# Patient Record
Sex: Female | Born: 1985
Health system: Southern US, Community
[De-identification: ages and names within clinical notes are randomized; demographics above are authoritative.]

## PROBLEM LIST (undated history)

## (undated) DIAGNOSIS — B999 Unspecified infectious disease: Secondary | ICD-10-CM

## (undated) DIAGNOSIS — F53 Postpartum depression: Secondary | ICD-10-CM

## (undated) DIAGNOSIS — Z87898 Personal history of other specified conditions: Secondary | ICD-10-CM

## (undated) DIAGNOSIS — E039 Hypothyroidism, unspecified: Secondary | ICD-10-CM

## (undated) DIAGNOSIS — R87629 Unspecified abnormal cytological findings in specimens from vagina: Secondary | ICD-10-CM

## (undated) DIAGNOSIS — O99345 Other mental disorders complicating the puerperium: Secondary | ICD-10-CM

## (undated) DIAGNOSIS — R51 Headache: Secondary | ICD-10-CM

## (undated) HISTORY — DX: Hypothyroidism, unspecified: E03.9

## (undated) HISTORY — DX: Other mental disorders complicating the puerperium: O99.345

## (undated) HISTORY — DX: Postpartum depression: F53.0

## (undated) HISTORY — PX: NO PAST SURGERIES: SHX2092

## (undated) HISTORY — DX: Personal history of other specified conditions: Z87.898

## (undated) HISTORY — DX: Headache: R51

## (undated) HISTORY — DX: Unspecified infectious disease: B99.9

---

## 2008-03-23 DIAGNOSIS — E039 Hypothyroidism, unspecified: Secondary | ICD-10-CM

## 2008-03-23 HISTORY — DX: Hypothyroidism, unspecified: E03.9

## 2011-03-24 NOTE — L&D Delivery Note (Signed)
Delivery Note At 7:39 PM a viable female, "Cynthia Love", was delivered via Vaginal, Spontaneous Delivery (Presentation: ; Occiput Anterior).  APGAR: 9, 9; weight .   Placenta status: Intact, Spontaneous.  Cord: 3 vessels with the following complications: None.  Cord pH: NA  Anesthesia: None  Episiotomy: None Lacerations: 2nd degree perineal, irregular edges; bilateral periurethral 1st degree lacerations--right side required 1 stitch for hemostasis.  Red Robinson catheter placed in urethra prior to repair to avoid urethral entry with suture. Suture Repair: 3.0 and 4.0 monocryl Est. Blood Loss (mL):  300 cc.  Mom to postpartum.  Baby to skin to skin.. Family desires inpatient circumcision.  Nigel Bridgeman 03/15/2012, 8:29 PM

## 2011-09-18 ENCOUNTER — Encounter: Payer: Self-pay | Admitting: Obstetrics and Gynecology

## 2011-09-18 ENCOUNTER — Ambulatory Visit (INDEPENDENT_AMBULATORY_CARE_PROVIDER_SITE_OTHER): Payer: 59 | Admitting: Obstetrics and Gynecology

## 2011-09-18 VITALS — BP 110/62 | Wt 153.0 lb

## 2011-09-18 DIAGNOSIS — Z331 Pregnant state, incidental: Secondary | ICD-10-CM | POA: Insufficient documentation

## 2011-09-18 DIAGNOSIS — E039 Hypothyroidism, unspecified: Secondary | ICD-10-CM

## 2011-09-18 DIAGNOSIS — Z3201 Encounter for pregnancy test, result positive: Secondary | ICD-10-CM

## 2011-09-18 DIAGNOSIS — Z124 Encounter for screening for malignant neoplasm of cervix: Secondary | ICD-10-CM

## 2011-09-18 LAB — POCT URINALYSIS DIPSTICK
Leukocytes, UA: NEGATIVE
Nitrite, UA: NEGATIVE
Urobilinogen, UA: NEGATIVE

## 2011-09-18 LAB — POCT WET PREP (WET MOUNT)
Clue Cells Wet Prep Whiff POC: NEGATIVE
pH: 4

## 2011-09-18 NOTE — Progress Notes (Signed)
Patient ID: Cynthia Love, female   DOB: June 14, 1985, 26 y.o.   MRN: [id number]  Chief Complaint  Patient presents with  . Initial Prenatal Visit    HPI Cynthia Love is a 26 y.o. female.   HPI  Past Medical History  Diagnosis Date  . Hypothyroidism 2010  . Infection     UTI X 1  . Headache     MIGRAINE 1-2 X MONTH;  OTC MED    Past Surgical History  Procedure Date  . No past surgeries     Family History  Problem Relation Age of Onset  . Hashimoto's thyroiditis Mother   . Heart disease Father     MYCOCARDIAL INFARCTION  . Early death Father 37  . Hyperlipidemia Father   . Hypertension Father   . Kidney disease Father     DIALYSIS  . Schizophrenia Maternal Aunt   . Cancer Maternal Grandmother     PANCREATIC  . Cancer Maternal Grandfather     NON HODGKINS LYNMPHOMA  . Heart disease Paternal Grandmother   . Heart disease Paternal Grandfather     Social History History  Substance Use Topics  . Smoking status: Never Smoker   . Smokeless tobacco: Never Used  . Alcohol Use: No     1-2 X/MONTH    No Known Allergies  Current Outpatient Prescriptions  Medication Sig Dispense Refill  . fish oil-omega-3 fatty acids 1000 MG capsule Take 1 capsule by mouth daily.      Marland Kitchen levothyroxine (SYNTHROID, LEVOTHROID) 100 MCG tablet Take 100 mcg by mouth daily.      . Prenatal Vit-Fe Sulfate-FA (PRENATAL VITAMIN PO) Take 1 tablet by mouth. OTC        Review of Systems Review of Systems  Constitutional: Positive for activity change. Negative for fever, chills, diaphoresis, appetite change, fatigue and unexpected weight change.  HENT: Negative for hearing loss, ear pain, nosebleeds, congestion, facial swelling, rhinorrhea, sneezing, neck pain, neck stiffness, postnasal drip, tinnitus and ear discharge.   Eyes: Negative.  Negative for photophobia, pain, discharge, redness, itching and visual disturbance.  Respiratory: Negative for apnea, cough, choking, chest tightness,  shortness of breath and stridor.   Cardiovascular: Negative.  Negative for chest pain.  Genitourinary: Negative.   Neurological: Negative.   Hematological: Negative.   Psychiatric/Behavioral: Negative.     Blood pressure 110/62, weight 69.4 kg (153 lb), last menstrual period 05/30/2011.  Physical Exam Physical Exam  Constitutional: She is oriented to person, place, and time. She appears well-developed and well-nourished. No distress.  HENT:  Head: Normocephalic.  Mouth/Throat: No oropharyngeal exudate.  Eyes: Pupils are equal, round, and reactive to light.  Neck: Normal range of motion.  Cardiovascular: Normal rate.   Pulmonary/Chest: Effort normal.  Abdominal: Soft.  Genitourinary: Vagina normal.       Fundal height to dates  FHT: 165  Musculoskeletal: Normal range of motion.  Neurological: She is alert and oriented to person, place, and time. She has normal reflexes.  Skin: Skin is warm and dry. She is not diaphoretic.  Psychiatric: She has a normal mood and affect.     Earl Gala Intrauterine Pregnancy @ 14+6 wks, No complaints. Had n&v but states improved Speculum examination: Wet prep: Norm, GC, Chlamydia cultures to lab, no pap and norm x1 yr FHT heard with Doppler: 165 FH:13.5cms Continues on Synthroid po daily and PNV daily Bowels: regular Declined genetic screening at this time and wishes to wait until after anatomy screen for quad  screen.had Prenatal panel and thyroid panel today 09/18/11 Plan:

## 2011-09-18 NOTE — Progress Notes (Signed)
Plan: Prenatal panel and thyroid panel today 09/17/12 Patient wishes to breast feed Patient would like to have water birth and possibly Elige Radon Method (info given) Wishes to wait for Quad screen S/P anatomy USS ROB x 4 weeks for anatomy USS          ROB x 4 weeks -

## 2011-09-19 LAB — GC/CHLAMYDIA PROBE AMP, GENITAL
Chlamydia, DNA Probe: NEGATIVE
GC Probe Amp, Genital: NEGATIVE

## 2011-09-19 LAB — TOXOPLASMA ANTIBODIES- IGG AND  IGM: Toxoplasma IgG Ratio: 0.7 IU/mL (ref <6.4)

## 2011-09-19 LAB — PRENATAL PANEL VII
Antibody Screen: NEGATIVE
Basophils Absolute: 0 10*3/uL (ref 0.0–0.1)
Basophils Relative: 0 % (ref 0–1)
Eosinophils Absolute: 0.1 10*3/uL (ref 0.0–0.7)
Eosinophils Relative: 2 % (ref 0–5)
Lymphocytes Relative: 23 % (ref 12–46)
MCV: 84 fL (ref 78.0–100.0)
Platelets: 175 10*3/uL (ref 150–400)
RDW: 14.2 % (ref 11.5–15.5)
WBC: 7.1 10*3/uL (ref 4.0–10.5)

## 2011-09-19 LAB — THYROID PANEL WITH TSH
T3 Uptake: 20.8 % — ABNORMAL LOW (ref 22.5–37.0)
TSH: 5.974 u[IU]/mL — ABNORMAL HIGH (ref 0.350–4.500)

## 2011-09-20 LAB — CULTURE, OB URINE: Colony Count: 5000

## 2011-09-21 ENCOUNTER — Telehealth: Payer: Self-pay | Admitting: Obstetrics and Gynecology

## 2011-09-21 NOTE — Telephone Encounter (Signed)
Discussed po fluids with calories when food is not tolerated and go to sips of fluids q 15 minutes with gradually increasing over the next hours for better tolerates then a lot on stomach at one time, come in for evaluation if N,V persists over the next hour or two, do not repeat zofran until 8 hours. Lavera Guise, CNM

## 2011-09-23 ENCOUNTER — Other Ambulatory Visit: Payer: Self-pay

## 2011-09-23 ENCOUNTER — Telehealth: Payer: Self-pay

## 2011-09-23 DIAGNOSIS — R7989 Other specified abnormal findings of blood chemistry: Secondary | ICD-10-CM

## 2011-09-23 NOTE — Telephone Encounter (Signed)
Lm on vm tcb rgd labs 

## 2011-09-23 NOTE — Telephone Encounter (Signed)
Spoke with pt rgd labs informed thyroid panel abnormal need f/u with pcp or endocrinologist pt states she doesnt have one at this time advised pt will refer to endocrinologist they will call with appt pt voice understanding

## 2011-09-23 NOTE — Telephone Encounter (Signed)
Message copied by Rolla Plate on Wed Sep 23, 2011  9:11 AM ------      Message from: Cynthia Love      Created: Tue Sep 22, 2011 11:20 PM       Pt with abnormal thyroid panel.  Refer pt to her PCP or endocrinologist for evaluation

## 2011-09-28 ENCOUNTER — Telehealth: Payer: Self-pay

## 2011-09-28 NOTE — Telephone Encounter (Signed)
Spoke with pt rgd referral to endocrinologist pt has appt 10/06/11 with Dr.preston clark at 12:00 pt voice understanding

## 2011-10-16 ENCOUNTER — Encounter: Payer: Self-pay | Admitting: Obstetrics and Gynecology

## 2011-10-16 ENCOUNTER — Ambulatory Visit (INDEPENDENT_AMBULATORY_CARE_PROVIDER_SITE_OTHER): Payer: 59

## 2011-10-16 ENCOUNTER — Ambulatory Visit (INDEPENDENT_AMBULATORY_CARE_PROVIDER_SITE_OTHER): Payer: 59 | Admitting: Obstetrics and Gynecology

## 2011-10-16 VITALS — BP 102/58 | Ht 63.0 in | Wt 156.0 lb

## 2011-10-16 DIAGNOSIS — Z331 Pregnant state, incidental: Secondary | ICD-10-CM

## 2011-10-16 DIAGNOSIS — E039 Hypothyroidism, unspecified: Secondary | ICD-10-CM

## 2011-10-16 DIAGNOSIS — O99280 Endocrine, nutritional and metabolic diseases complicating pregnancy, unspecified trimester: Secondary | ICD-10-CM

## 2011-10-16 DIAGNOSIS — Z3689 Encounter for other specified antenatal screening: Secondary | ICD-10-CM

## 2011-10-16 DIAGNOSIS — E079 Disorder of thyroid, unspecified: Secondary | ICD-10-CM

## 2011-10-16 NOTE — Progress Notes (Signed)
Pt without c/o [place] s=d, cx 3.24 cm, vtx presentation with anterior placenta. Normal anatomy that was seen Profile, NB and outflow tracts not seen US@NV  f/u anatomy Endocrinologist increased pts synthroid Pt declined genetic testing

## 2011-10-16 NOTE — Progress Notes (Signed)
Pt voices no complaints today.

## 2011-10-19 LAB — US OB COMP + 14 WK

## 2011-11-13 ENCOUNTER — Encounter: Payer: Self-pay | Admitting: Obstetrics and Gynecology

## 2011-11-13 ENCOUNTER — Other Ambulatory Visit: Payer: 59

## 2011-11-16 ENCOUNTER — Encounter: Payer: Self-pay | Admitting: Obstetrics and Gynecology

## 2011-11-16 ENCOUNTER — Other Ambulatory Visit: Payer: Self-pay | Admitting: Obstetrics and Gynecology

## 2011-11-16 ENCOUNTER — Other Ambulatory Visit: Payer: Self-pay

## 2011-11-16 ENCOUNTER — Ambulatory Visit (INDEPENDENT_AMBULATORY_CARE_PROVIDER_SITE_OTHER): Payer: 59

## 2011-11-16 ENCOUNTER — Ambulatory Visit (INDEPENDENT_AMBULATORY_CARE_PROVIDER_SITE_OTHER): Payer: Self-pay | Admitting: Obstetrics and Gynecology

## 2011-11-16 VITALS — BP 92/58 | Wt 165.0 lb

## 2011-11-16 DIAGNOSIS — Z331 Pregnant state, incidental: Secondary | ICD-10-CM

## 2011-11-16 DIAGNOSIS — Z3689 Encounter for other specified antenatal screening: Secondary | ICD-10-CM

## 2011-11-16 LAB — US OB FOLLOW UP

## 2011-11-16 LAB — THYROID PANEL WITH TSH: T3 Uptake: 21.1 % — ABNORMAL LOW (ref 22.5–37.0)

## 2011-11-16 NOTE — Progress Notes (Signed)
[redacted]w[redacted]d Ultrasound: all anatomy seen and normal  Cervix=3.74 cm Toxo titers ordered today with thyroid panel Hypothyroidism followed by Dr Chestine Spore: Syntroid adjusted 1 month ago

## 2011-11-16 NOTE — Progress Notes (Signed)
Pt wants to check for toxoplasmosis

## 2011-11-17 LAB — TOXOPLASMA ANTIBODIES- IGG AND  IGM: Toxoplasma Antibody- IgM: <3 IU/mL (ref <8.0)

## 2011-11-18 NOTE — Progress Notes (Signed)
Quick Note:  Please inform that Thyroid panel is good and still not immune to toxoplasmosis. No litter duty ______

## 2011-12-14 ENCOUNTER — Ambulatory Visit (INDEPENDENT_AMBULATORY_CARE_PROVIDER_SITE_OTHER): Payer: 59 | Admitting: Obstetrics and Gynecology

## 2011-12-14 ENCOUNTER — Encounter: Payer: Self-pay | Admitting: Obstetrics and Gynecology

## 2011-12-14 ENCOUNTER — Other Ambulatory Visit: Payer: 59

## 2011-12-14 VITALS — BP 102/56 | Wt 174.0 lb

## 2011-12-14 DIAGNOSIS — Z331 Pregnant state, incidental: Secondary | ICD-10-CM

## 2011-12-14 LAB — HEMOGLOBIN: Hemoglobin: 12.2 g/dL (ref 12.0–15.0)

## 2011-12-14 NOTE — Progress Notes (Signed)
Pt has concerns about vision changes over the weekend after 12 hr shift.  Had water and snack with resolution after 15 mins. BP was nl.   10 mins later she had numbness in two fingers and her tongue on Friday.This resolved after about 10 mins Unable to leave urine specimen, glucola given today. Dextro stick 165 S>d.  [place] NV Will check hgb and glu today Note to anesthesia for language line consult for husband for epidural

## 2011-12-15 LAB — RPR

## 2011-12-16 ENCOUNTER — Telehealth: Payer: Self-pay | Admitting: Obstetrics and Gynecology

## 2011-12-16 ENCOUNTER — Other Ambulatory Visit: Payer: Self-pay | Admitting: Obstetrics and Gynecology

## 2011-12-16 DIAGNOSIS — O99810 Abnormal glucose complicating pregnancy: Secondary | ICD-10-CM

## 2011-12-16 NOTE — Telephone Encounter (Signed)
LM to CB pt needs 3 hr GTT sch.

## 2011-12-16 NOTE — Telephone Encounter (Signed)
Notified pt of elevated 1 hr glucola.  Sch pt for 3 hr GTT on 12-25-2011.  Mailed pt diet and appt info.

## 2011-12-26 LAB — GLUCOSE TOLERANCE, 3 HOURS
Glucose Tolerance, 1 hour: 104 mg/dL (ref 70–189)
Glucose Tolerance, 2 hour: 137 mg/dL (ref 70–164)
Glucose Tolerance, Fasting: 79 mg/dL (ref 70–104)
Glucose, GTT - 3 Hour: 135 mg/dL (ref 70–144)

## 2012-01-11 ENCOUNTER — Ambulatory Visit (INDEPENDENT_AMBULATORY_CARE_PROVIDER_SITE_OTHER): Payer: 59 | Admitting: Obstetrics and Gynecology

## 2012-01-11 ENCOUNTER — Ambulatory Visit (INDEPENDENT_AMBULATORY_CARE_PROVIDER_SITE_OTHER): Payer: 59

## 2012-01-11 ENCOUNTER — Encounter: Payer: Self-pay | Admitting: Obstetrics and Gynecology

## 2012-01-11 VITALS — BP 90/62 | Wt 182.0 lb

## 2012-01-11 DIAGNOSIS — Z331 Pregnant state, incidental: Secondary | ICD-10-CM

## 2012-01-11 DIAGNOSIS — Z23 Encounter for immunization: Secondary | ICD-10-CM

## 2012-01-11 DIAGNOSIS — O26849 Uterine size-date discrepancy, unspecified trimester: Secondary | ICD-10-CM

## 2012-01-11 LAB — US OB FOLLOW UP

## 2012-01-11 NOTE — Progress Notes (Signed)
[redacted]w[redacted]d Flu shot given today w/o difficulty  Pt states no concerns

## 2012-01-11 NOTE — Progress Notes (Signed)
[redacted]w[redacted]d Patient had USS for growth today as size greater than dates. USS report: Growth normal 22nd %tile. Vx presentation Anterior Placenta AFI - normal Had flu shot today 3 hr GTT reviewed with patient - normal 3 hr 135 Thyroid panel reviewed: Normal  Patient had seen Endocrinologist - Eu-thryoid Thyroid panel at 37 weeks Continues on Synthroid ROB x 2 weeks

## 2012-01-11 NOTE — Progress Notes (Signed)
Reviewed s/s preterm labor, srom, vag bleeding,daily kick counts to report, encouraged 8 water daily and frequent voids. Lavera Guise, CNM

## 2012-01-25 ENCOUNTER — Ambulatory Visit (INDEPENDENT_AMBULATORY_CARE_PROVIDER_SITE_OTHER): Payer: 59 | Admitting: Obstetrics and Gynecology

## 2012-01-25 VITALS — BP 98/62 | Wt 187.0 lb

## 2012-01-25 DIAGNOSIS — Z331 Pregnant state, incidental: Secondary | ICD-10-CM

## 2012-01-25 NOTE — Progress Notes (Signed)
Patient ID: ALIYYAH RIESE, female   DOB: Aug 28, 1985, 26 y.o.   MRN: [id number] [redacted]w[redacted]d Discussed common discomforts of pg, excessive weight gain, nutrition. Avoid sauces, gravy, sweet tea, soda, fried foods, limit eating out and watch food choices and portion sizes. No more than 1/2 cup juice daily, better to eat fruit then drink juice. Increase fiber in diet, fresh rather than processed foods. Get protein throught meals and snacks to include meat, eggs, beans, nuts skim an fat free dairy: milk, cheese, yougart, 8 glasses of water daily. Exercise discussed. Reviewed s/s preterm labor, srom, vag bleeding, kick counts to report, enc 8 water daily and frequent voids. Lavera Guise, CNM

## 2012-01-25 NOTE — Progress Notes (Signed)
Pt stated no issues today.  

## 2012-02-08 ENCOUNTER — Ambulatory Visit (INDEPENDENT_AMBULATORY_CARE_PROVIDER_SITE_OTHER): Payer: 59 | Admitting: Obstetrics and Gynecology

## 2012-02-08 VITALS — BP 102/66 | Wt 188.0 lb

## 2012-02-08 DIAGNOSIS — Z331 Pregnant state, incidental: Secondary | ICD-10-CM

## 2012-02-08 NOTE — Progress Notes (Signed)
[redacted]w[redacted]d.  Pt c/o groin pain x 5 days.  gbs done

## 2012-02-08 NOTE — Addendum Note (Signed)
Addended by: Lerry Liner D on: 02/08/2012 09:42 AM   Modules accepted: Orders

## 2012-02-10 LAB — STREP B DNA PROBE: GBSP: NEGATIVE

## 2012-02-15 ENCOUNTER — Ambulatory Visit (INDEPENDENT_AMBULATORY_CARE_PROVIDER_SITE_OTHER): Payer: 59 | Admitting: Obstetrics and Gynecology

## 2012-02-15 VITALS — BP 130/62 | Wt 193.0 lb

## 2012-02-15 DIAGNOSIS — O2686 Pruritic urticarial papules and plaques of pregnancy (PUPPP): Secondary | ICD-10-CM

## 2012-02-15 DIAGNOSIS — Z331 Pregnant state, incidental: Secondary | ICD-10-CM

## 2012-02-15 DIAGNOSIS — O26899 Other specified pregnancy related conditions, unspecified trimester: Secondary | ICD-10-CM

## 2012-02-15 DIAGNOSIS — L988 Other specified disorders of the skin and subcutaneous tissue: Secondary | ICD-10-CM

## 2012-02-15 DIAGNOSIS — Z3490 Encounter for supervision of normal pregnancy, unspecified, unspecified trimester: Secondary | ICD-10-CM

## 2012-02-15 MED ORDER — TRIAMCINOLONE ACETONIDE 0.5 % EX CREA: TOPICAL_CREAM | Freq: Three times a day (TID) | CUTANEOUS | Status: DC

## 2012-02-15 NOTE — Progress Notes (Signed)
[redacted]w[redacted]d Pt denies ha's, dizziness, or visual changes. Pt c/o localized rash on upper abdomen accompanied by itching x approx 2 days.  Probable PUPP.  Rec: Triamcinolone cream   Declines cx check.

## 2012-02-24 ENCOUNTER — Encounter: Payer: Self-pay | Admitting: Obstetrics and Gynecology

## 2012-02-24 ENCOUNTER — Ambulatory Visit (INDEPENDENT_AMBULATORY_CARE_PROVIDER_SITE_OTHER): Payer: 59 | Admitting: Obstetrics and Gynecology

## 2012-02-24 VITALS — BP 124/60 | Wt 196.0 lb

## 2012-02-24 DIAGNOSIS — Z331 Pregnant state, incidental: Secondary | ICD-10-CM

## 2012-02-24 NOTE — Progress Notes (Signed)
[redacted]w[redacted]d A/P GBS negative Fetal kick counts reviewed Labor reviewed with pt All patients  questions answered Reviewed birth plan with the pt

## 2012-02-24 NOTE — Patient Instructions (Signed)

## 2012-03-01 ENCOUNTER — Encounter: Payer: Self-pay | Admitting: Obstetrics and Gynecology

## 2012-03-01 ENCOUNTER — Ambulatory Visit (INDEPENDENT_AMBULATORY_CARE_PROVIDER_SITE_OTHER): Payer: 59 | Admitting: Obstetrics and Gynecology

## 2012-03-01 VITALS — BP 118/70 | Wt 193.0 lb

## 2012-03-01 DIAGNOSIS — Z331 Pregnant state, incidental: Secondary | ICD-10-CM

## 2012-03-01 NOTE — Progress Notes (Signed)
[redacted]w[redacted]d Decline cervix check

## 2012-03-01 NOTE — Progress Notes (Signed)
[redacted]w[redacted]d Doing well. Circumcision and vitamin K discussed. Return office in 1 week. Dr. Stefano Gaul

## 2012-03-08 ENCOUNTER — Encounter: Payer: Self-pay | Admitting: Obstetrics and Gynecology

## 2012-03-08 ENCOUNTER — Ambulatory Visit (INDEPENDENT_AMBULATORY_CARE_PROVIDER_SITE_OTHER): Payer: 59 | Admitting: Obstetrics and Gynecology

## 2012-03-08 VITALS — BP 100/68 | Wt 194.0 lb

## 2012-03-08 DIAGNOSIS — Z3490 Encounter for supervision of normal pregnancy, unspecified, unspecified trimester: Secondary | ICD-10-CM

## 2012-03-08 DIAGNOSIS — Z331 Pregnant state, incidental: Secondary | ICD-10-CM

## 2012-03-08 NOTE — Progress Notes (Signed)
[redacted]w[redacted]d No complaints Several questions about circumcision answered RTO 1wk Sched indxn at 41wks Had nl thyroid testing in Nov per pt with endocrinologist

## 2012-03-09 ENCOUNTER — Telehealth: Payer: Self-pay | Admitting: Obstetrics and Gynecology

## 2012-03-09 NOTE — Telephone Encounter (Signed)
Induction scheduled for 03/19/12 @ 7:30pm.  With ND/MK. Cynthia Love

## 2012-03-14 ENCOUNTER — Telehealth (HOSPITAL_COMMUNITY): Payer: Self-pay | Admitting: *Deleted

## 2012-03-14 ENCOUNTER — Encounter (HOSPITAL_COMMUNITY): Payer: Self-pay | Admitting: *Deleted

## 2012-03-14 ENCOUNTER — Telehealth: Payer: Self-pay | Admitting: Obstetrics and Gynecology

## 2012-03-14 NOTE — Telephone Encounter (Signed)
Preadmission screen  

## 2012-03-14 NOTE — Telephone Encounter (Signed)
Induction rescheduled to 03/20/12 @ 7:00am.  With ND/VL. -Adrianne Pridgen

## 2012-03-15 ENCOUNTER — Inpatient Hospital Stay (HOSPITAL_COMMUNITY)
Admission: AD | Admit: 2012-03-15 | Discharge: 2012-03-17 | DRG: 775 | Disposition: A | Discharge status: Home / Self Care | Payer: 59 | Source: Ambulatory Visit | Attending: Obstetrics and Gynecology | Admitting: Obstetrics and Gynecology

## 2012-03-15 ENCOUNTER — Telehealth: Payer: Self-pay | Admitting: Obstetrics and Gynecology

## 2012-03-15 ENCOUNTER — Encounter (HOSPITAL_COMMUNITY): Payer: Self-pay | Admitting: *Deleted

## 2012-03-15 DIAGNOSIS — O99284 Endocrine, nutritional and metabolic diseases complicating childbirth: Secondary | ICD-10-CM | POA: Diagnosis present

## 2012-03-15 DIAGNOSIS — E039 Hypothyroidism, unspecified: Secondary | ICD-10-CM | POA: Diagnosis present

## 2012-03-15 DIAGNOSIS — IMO0001 Reserved for inherently not codable concepts without codable children: Secondary | ICD-10-CM

## 2012-03-15 DIAGNOSIS — E079 Disorder of thyroid, unspecified: Secondary | ICD-10-CM | POA: Diagnosis present

## 2012-03-15 LAB — TYPE AND SCREEN
ABO/RH(D): AB POS
Antibody Screen: NEGATIVE

## 2012-03-15 LAB — CBC
HCT: 39.6 % (ref 36.0–46.0)
Hemoglobin: 13.7 g/dL (ref 12.0–15.0)
WBC: 13.8 10*3/uL — ABNORMAL HIGH (ref 4.0–10.5)

## 2012-03-15 LAB — ABO/RH: ABO/RH(D): AB POS

## 2012-03-15 MED ORDER — SODIUM CHLORIDE 0.9 % IJ SOLN: 3.0000 mL | INTRAMUSCULAR | Status: DC | PRN

## 2012-03-15 MED ORDER — PROMETHAZINE HCL 25 MG/ML IJ SOLN: 12.5000 mg | INTRAMUSCULAR | Status: DC | PRN

## 2012-03-15 MED ORDER — LEVOTHYROXINE SODIUM 150 MCG PO TABS
150.0000 ug | ORAL_TABLET | ORAL | Status: DC
  Administered 2012-03-15: 150 ug via ORAL
  Filled 2012-03-15 (×2): qty 1

## 2012-03-15 MED ORDER — IBUPROFEN 600 MG PO TABS
600.0000 mg | ORAL_TABLET | Freq: Four times a day (QID) | ORAL | Status: DC
  Administered 2012-03-15 – 2012-03-17 (×7): 600 mg via ORAL
  Filled 2012-03-15 (×7): qty 1

## 2012-03-15 MED ORDER — OXYCODONE-ACETAMINOPHEN 5-325 MG PO TABS
1.0000 | ORAL_TABLET | ORAL | Status: DC | PRN
  Administered 2012-03-16: 1 via ORAL
  Filled 2012-03-15: qty 1

## 2012-03-15 MED ORDER — SODIUM CHLORIDE 0.9 % IV SOLN: 250.0000 mL | INTRAVENOUS | Status: DC | PRN

## 2012-03-15 MED ORDER — LIDOCAINE HCL (PF) 1 % IJ SOLN
30.0000 mL | INTRAMUSCULAR | Status: DC | PRN
  Administered 2012-03-15: 30 mL via SUBCUTANEOUS
  Filled 2012-03-15: qty 30

## 2012-03-15 MED ORDER — ONDANSETRON HCL 4 MG/2ML IJ SOLN: 4.0000 mg | INTRAMUSCULAR | Status: DC | PRN

## 2012-03-15 MED ORDER — OXYTOCIN 40 UNITS IN LACTATED RINGERS INFUSION - SIMPLE MED
62.5000 mL/h | INTRAVENOUS | Status: DC
  Administered 2012-03-15: 62.5 mL/h via INTRAVENOUS
  Filled 2012-03-15: qty 1000

## 2012-03-15 MED ORDER — WITCH HAZEL-GLYCERIN EX PADS: 1.0000 "application " | MEDICATED_PAD | CUTANEOUS | Status: DC | PRN

## 2012-03-15 MED ORDER — CITRIC ACID-SODIUM CITRATE 334-500 MG/5ML PO SOLN: 30.0000 mL | ORAL | Status: DC | PRN

## 2012-03-15 MED ORDER — BENZOCAINE-MENTHOL 20-0.5 % EX AERO: 1.0000 "application " | INHALATION_SPRAY | CUTANEOUS | Status: DC | PRN

## 2012-03-15 MED ORDER — ZOLPIDEM TARTRATE 5 MG PO TABS: 5.0000 mg | ORAL_TABLET | Freq: Every evening | ORAL | Status: DC | PRN

## 2012-03-15 MED ORDER — SENNOSIDES-DOCUSATE SODIUM 8.6-50 MG PO TABS
2.0000 | ORAL_TABLET | Freq: Every day | ORAL | Status: DC
  Administered 2012-03-15 – 2012-03-16 (×2): 2 via ORAL

## 2012-03-15 MED ORDER — SODIUM CHLORIDE 0.9 % IJ SOLN: 3.0000 mL | Freq: Two times a day (BID) | INTRAMUSCULAR | Status: DC

## 2012-03-15 MED ORDER — DIBUCAINE 1 % RE OINT: 1.0000 "application " | TOPICAL_OINTMENT | RECTAL | Status: DC | PRN

## 2012-03-15 MED ORDER — BUTORPHANOL TARTRATE 1 MG/ML IJ SOLN: 2.0000 mg | INTRAMUSCULAR | Status: DC | PRN

## 2012-03-15 MED ORDER — ONDANSETRON HCL 4 MG/2ML IJ SOLN: 4.0000 mg | Freq: Four times a day (QID) | INTRAMUSCULAR | Status: DC | PRN

## 2012-03-15 MED ORDER — DIPHENHYDRAMINE HCL 25 MG PO CAPS: 25.0000 mg | ORAL_CAPSULE | Freq: Four times a day (QID) | ORAL | Status: DC | PRN

## 2012-03-15 MED ORDER — OXYCODONE-ACETAMINOPHEN 5-325 MG PO TABS: 1.0000 | ORAL_TABLET | ORAL | Status: DC | PRN

## 2012-03-15 MED ORDER — LANOLIN HYDROUS EX OINT: TOPICAL_OINTMENT | CUTANEOUS | Status: DC | PRN

## 2012-03-15 MED ORDER — OXYTOCIN BOLUS FROM INFUSION: 500.0000 mL | INTRAVENOUS | Status: DC

## 2012-03-15 MED ORDER — SIMETHICONE 80 MG PO CHEW: 80.0000 mg | CHEWABLE_TABLET | ORAL | Status: DC | PRN

## 2012-03-15 MED ORDER — IBUPROFEN 600 MG PO TABS
600.0000 mg | ORAL_TABLET | Freq: Four times a day (QID) | ORAL | Status: DC | PRN
  Filled 2012-03-15: qty 1

## 2012-03-15 MED ORDER — ACETAMINOPHEN 325 MG PO TABS: 650.0000 mg | ORAL_TABLET | ORAL | Status: DC | PRN

## 2012-03-15 MED ORDER — TETANUS-DIPHTH-ACELL PERTUSSIS 5-2.5-18.5 LF-MCG/0.5 IM SUSP: 0.5000 mL | Freq: Once | INTRAMUSCULAR | Status: DC

## 2012-03-15 MED ORDER — ONDANSETRON HCL 4 MG PO TABS: 4.0000 mg | ORAL_TABLET | ORAL | Status: DC | PRN

## 2012-03-15 MED ORDER — PRENATAL MULTIVITAMIN CH
1.0000 | ORAL_TABLET | Freq: Every day | ORAL | Status: DC
  Administered 2012-03-17: 1 via ORAL
  Filled 2012-03-15: qty 1

## 2012-03-15 MED ORDER — LEVOTHYROXINE SODIUM 150 MCG PO TABS
150.0000 ug | ORAL_TABLET | ORAL | Status: DC
  Filled 2012-03-15: qty 1

## 2012-03-15 NOTE — MAU Note (Signed)
Patient states she is having contractions every 3-4 minutes with mucus discharge. Denies leaking and had some bloody show with the mucus this morning. Reports good fetal movement.

## 2012-03-15 NOTE — H&P (Signed)
Cynthia Love is a 26 y.o. female presenting for contractions every 3-4 minutes with mucus discharge. Denies leaking and had some bloody show with the mucus this morning. Reports good fetal movement.  52 # weight gain with pregnancy  History OB History    Grav Para Term Preterm Abortions TAB SAB Ect Mult Living   2    1   1   0    6/12 ectopic Past Medical History  Diagnosis Date  . Hypothyroidism 2010  . Infection     UTI X 1  . Headache     MIGRAINE 1-2 X MONTH;  OTC MED   Past Surgical History  Procedure Date  . No past surgeries    Family History: family history includes Cancer in her maternal grandfather and maternal grandmother; Early death (age of onset:55) in her father; Hashimoto's thyroiditis in her mother; Heart disease in her father, paternal grandfather, and paternal grandmother; Hyperlipidemia in her father; Hypertension in her father; Kidney disease in her father; and Schizophrenia in her maternal aunt. Social History:  reports that she has never smoked. She has never used smokeless tobacco. She reports that she does not drink alcohol or use illicit drugs. Medications Prior to Admission  Medication Sig Dispense Refill  . fish oil-omega-3 fatty acids 1000 MG capsule Take 1 capsule by mouth daily.      Marland Kitchen levothyroxine (SYNTHROID, LEVOTHROID) 150 MCG tablet Take 150 mcg by mouth daily.      . Prenatal Vit-Fe Fumarate-FA (PRENATAL MULTIVITAMIN) TABS Take 1 tablet by mouth daily.      Marland Kitchen triamcinolone cream (KENALOG) 0.5 % Apply 1 application topically 3 (three) times daily as needed. For skin reaction      . [DISCONTINUED] triamcinolone cream (KENALOG) 0.5 % Apply topically 3 (three) times daily. Use sparingly  30 g  1    Prenatal Transfer Tool  Maternal Diabetes: No neg 3 gtt Genetic Screening: Declined Maternal Ultrasounds/Referrals: Normal anterior placenta Fetal Ultrasounds or other Referrals:  None Maternal Substance Abuse:  No not tested Significant Maternal  Medications:  Meds include: Syntroid Other:  Significant Maternal Lab Results:  None Other Comments:  None  Review of Systems  Constitutional: Negative.   HENT: Negative.   Eyes: Negative.   Respiratory: Negative.   Gastrointestinal: Negative.   Genitourinary: Negative.   Musculoskeletal: Negative.   Calm, no distress, cooperative, HEENT WNL grossly,  lungs clear bilaterally, AP RRR,  abd soft nt, sounds active,  No edema to lower extremities fhts category 1 uc q 1-3 mod  Dilation: 7 Effacement (%): 90 Station: -1 Exam by:: L. Paschal, RN Blood pressure 138/92, pulse 84, temperature 98.1 F (36.7 C), temperature source Oral, resp. rate 20, height 5\' 3"  (1.6 m), weight 197 lb 6.4 oz (89.54 kg), last menstrual period 05/30/2011, SpO2 100.00%. Exam Physical Exam  Prenatal labs: ABO, Rh: AB/POS/-- (06/28 1208) Antibody: NEG (06/28 1208) Rubella: 29.0 (06/28 1208) RPR: NON REAC (09/23 0928)  HBsAg: NEGATIVE (06/28 1208)  HIV: NON REACTIVE (06/28 1208)  GBS: NEGATIVE (11/18 9604)   Assessment/Plan: [redacted]w[redacted]d GBS neg Active labor, routine admission, collaboration with Dr. Stefano Gaul.  Cynthia Love 03/15/2012, 4:54 PM

## 2012-03-15 NOTE — Telephone Encounter (Signed)
Reviewed s/s uc, srom, vag bleeding, daily fetal kick counts to report, comfort measures. Encouraged 8 water daily and frequent voids. Lavera Guise, CNM

## 2012-03-16 ENCOUNTER — Encounter (HOSPITAL_COMMUNITY): Payer: Self-pay | Admitting: *Deleted

## 2012-03-16 LAB — CBC
MCV: 87.2 fL (ref 78.0–100.0)
Platelets: 130 10*3/uL — ABNORMAL LOW (ref 150–400)
RBC: 3.97 MIL/uL (ref 3.87–5.11)
RDW: 13.2 % (ref 11.5–15.5)
WBC: 14.6 10*3/uL — ABNORMAL HIGH (ref 4.0–10.5)

## 2012-03-16 MED ORDER — LEVOTHYROXINE SODIUM 150 MCG PO TABS
150.0000 ug | ORAL_TABLET | ORAL | Status: DC
  Administered 2012-03-16: 150 ug via ORAL
  Filled 2012-03-16 (×2): qty 1

## 2012-03-16 NOTE — Progress Notes (Signed)
Post Partum Day 1 Subjective: up ad lib, voiding, tolerating PO, + flatus and urge to defacate, but no success yet.  working on [employer].  Newborn grunty this AM, and RN at bedside checking sats.  Pt's s.o. at bedside.  No breakfast yet. Desire inpt circ.  Objective: Blood pressure 137/72, pulse 74, temperature 97.8 F (36.6 C), temperature source Oral, resp. rate 18, height 5\' 3"  (1.6 m), weight 197 lb 6.4 oz (89.54 kg), last menstrual period 05/30/2011, SpO2 100.00%.  Physical Exam:  General: alert, cooperative, no distress and mildly obese Lochia: appropriate, moderate rubra Uterine Fundus: firm, U/-2 Incision: n/a DVT Evaluation: No evidence of DVT seen on physical exam. Negative Homan's sign.   Basename 03/16/12 0510 03/15/12 1710  HGB 11.6* 13.7  HCT 34.6* 39.6    Assessment/Plan: Plan for discharge tomorrow, Breastfeeding, Lactation consult and Circumcision prior to discharge  Glycerin supp prn.   LOS: 1 day   Treyson Axel H 03/16/2012, 8:28 AM

## 2012-03-17 MED ORDER — IBUPROFEN 600 MG PO TABS: 600.0000 mg | ORAL_TABLET | Freq: Four times a day (QID) | ORAL | Status: DC | PRN

## 2012-03-17 NOTE — Discharge Summary (Signed)
Physician Discharge Summary  Patient ID: Cynthia Love MRN: [id number] DOB/AGE: [birth year] 27 y.o.  Admit date: 03/15/2012 Discharge date: 03/17/2012  Admission Diagnoses: [redacted]w[redacted]d  Active labor  Discharge Diagnoses:  Active Problems:  Vaginal delivery  Perineal laceration with delivery, second degree   Discharged Condition: stable  Hospital Course: [redacted]w[redacted]d  Active labor, natural labor, SVD,  Degree perineal and periurethral laceration, normal involution, lactating. Plans condom use. Discharge Exam: Blood pressure 118/85, pulse 85, temperature 97.7 F (36.5 C), temperature source Oral, resp. rate 18, height 5\' 3"  (1.6 m), weight 197 lb 6.4 oz (89.54 kg), last menstrual period 05/30/2011, SpO2 100.00%, unknown if currently breastfeeding. S: comfortable, little bleeding, slept little, baby in NICU on antibiotics, pumping milk     Plans condom use O BP 118/85  Pulse 85  Temp 97.7 F (36.5 C) (Oral)  Resp 18  Ht 5\' 3"  (1.6 m)  Wt 197 lb 6.4 oz (89.54 kg)  BMI 34.97 kg/m2  SpO2 100%  LMP 05/30/2011  Breastfeeding? Unknown     abd soft, nt, ff      sm  Flow perineum with 2 degree MLL withecchymotic without edema or drainage, well approximated clean intact     -Homans sign bilaterally,       Trace Edema    Component Value Date/Time   HGB 11.6* 03/16/2012 0510   HCT 34.6* 03/16/2012 0510    Disposition: Final discharge disposition not confirmed  Discharge Orders    Future Appointments: Provider: Department: Dept Phone: Center:   03/18/2012 10:45 AM Michael Litter, MD [facility] [phone] None     Future Orders Please Complete By Expires   Discharge patient          Medication List     As of 03/17/2012 12:15 PM    TAKE these medications         fish oil-omega-3 fatty acids 1000 MG capsule   Take 1 capsule by mouth daily.      ibuprofen 600 MG tablet   Commonly known as: ADVIL,MOTRIN   Take 1 tablet (600 mg total) by mouth every 6  (six) hours as needed for pain.      levothyroxine 150 MCG tablet   Commonly known as: SYNTHROID, LEVOTHROID   Take 150 mcg by mouth daily.      prenatal multivitamin Tabs   Take 1 tablet by mouth daily.      ASK your doctor about these medications         triamcinolone cream 0.5 %   Commonly known as: KENALOG   Apply 1 application topically 3 (three) times daily as needed. For skin reaction           Follow-up Information    Follow up with [facility]. In 6 weeks.   Contact information:   [address] [place] [address] [phone]       reveiwed s/s bleeding, pain, fever, calf pain to report.  SignedLavera Guise 03/17/2012, 12:15 PM

## 2012-03-17 NOTE — Plan of Care (Signed)
Problem: Discharge Progression Outcomes Goal: Other Discharge Outcomes/Goals Outcome: Completed/Met Date Met:  03/17/12 Infant in NICU,  Pt. Is stable and eligible for discharge, remainder of infant teaching will be completed by NICU staff

## 2012-03-18 ENCOUNTER — Encounter: Payer: 59 | Admitting: Obstetrics and Gynecology

## 2012-03-19 ENCOUNTER — Inpatient Hospital Stay (HOSPITAL_COMMUNITY): Admission: RE | Admit: 2012-03-19 | Payer: Self-pay | Source: Ambulatory Visit

## 2012-03-20 ENCOUNTER — Inpatient Hospital Stay (HOSPITAL_COMMUNITY): Admission: RE | Admit: 2012-03-20 | Payer: 59 | Source: Ambulatory Visit

## 2012-03-30 ENCOUNTER — Ambulatory Visit (HOSPITAL_COMMUNITY): Payer: 59

## 2012-04-04 ENCOUNTER — Ambulatory Visit (HOSPITAL_COMMUNITY): Payer: 59

## 2012-04-05 ENCOUNTER — Ambulatory Visit (HOSPITAL_COMMUNITY)
Admission: RE | Admit: 2012-04-05 | Discharge: 2012-04-05 | Disposition: A | Discharge status: Home / Self Care | Payer: 59 | Source: Ambulatory Visit | Attending: Obstetrics and Gynecology | Admitting: Obstetrics and Gynecology

## 2012-04-05 ENCOUNTER — Ambulatory Visit (HOSPITAL_COMMUNITY): Payer: 59

## 2012-04-05 NOTE — Progress Notes (Addendum)
Infant Lactation Consultation Outpatient Visit Note  Patient Name: Cynthia Love                     Baby: Cynthia Love Date of Birth: February 17, 1986                            DOB: [birth year] Birth Weight:  7lbs 3 oz                                Today's weight: 8 lbs 10 oz Gestational Age at Delivery: Gestational Age: <None> term Type of Delivery:   Breastfeeding History Frequency of Breastfeeding: on demand  About 8 times a day Length of Feeding: 30 minutes Voids: 6 or more per day Stools: 4 per day  Supplementing / Method: Pumping:  Type of Pump:Medela  DEP   Frequency: 4-5 times a day  Volume:  90-100 mls  Comments:Baby being supplemented after breast feeding, with both formula and EBM. Cynthia Love gets about 3 oz of Similac 20 for sensitive stomachs, 2-3 times a day  . Cynthia Love gets supplemented with EBM  3 oz about 5 times a day. Mom has sore nipple on left, Cynthia Love probabaly not latching deep enough. Cynthia Love will not latch to left breast.    Consultation Evaluation: Cynthia Love is a 12 week old term baby, who was in the NICU with a Group B strep infection. He is doing well, but has an upper respiratory infection/cough today. Mom brought Cynthia Love in to get help with latching Cynthia Love, with the hopes of eventually getting him to full breast feeding. Initial latch was to the right breast in football hold, the breast he usually will not latch to. Changing his position probably helped. He appeared to be latched well, but transferred zero. We then tried to latch him to mom's left breast - baby crying, upset, would not latch. We tried a 20 nipple shield, and he latched after about 10 minutes, and fed for about 15-20 minutes.  This time he transferred 26 mls, but would not latch again. I had mom feed him some formula, to calm him, and then tried again to latch him. He was calmer, sucked a few times, but then unlatched, again crying. I think he is very tired at this point. The plan is for mom to continue  trying to latch baby a few times a  day, with the 20 nipple shield, and to try the football hold on the right side. Mom also will try and latch after supplementation, for comfort sucking - to get Cynthia Love comfortable at the breast.   Initial Feeding Assessment: Pre-feed [id number] Post-feed WGNFAO:1308 Amount Transferred:0 Comments:football hold on right breast  Additional Feeding Assessment: Pre-feed Weight:3940 Post-feed MVHQIO:9629 Amount Transferred:26 Comments:on left breast with 20 nipple shield  Additional Feeding Assessment: Pre-feed Weight: Post-feed Weight: Amount Transferred: Comments:  Total Breast milk Transferred this Visit: 26 Total Supplement Given: 80 mls of formula  Additional Interventions:   Follow-Up  Mom will call for follow up lactation visit  sometime next week.      Alfred Levins 04/05/2012, 1:39 PM

## 2012-04-19 ENCOUNTER — Encounter: Payer: Self-pay | Admitting: Obstetrics and Gynecology

## 2012-04-19 ENCOUNTER — Ambulatory Visit: Payer: 59 | Admitting: Obstetrics and Gynecology

## 2012-04-19 VITALS — BP 120/68 | Resp 16 | Wt 180.0 lb

## 2012-04-19 DIAGNOSIS — F53 Postpartum depression: Secondary | ICD-10-CM | POA: Insufficient documentation

## 2012-04-19 HISTORY — DX: Postpartum depression: F53.0

## 2012-04-19 NOTE — Progress Notes (Signed)
Cynthia Love  is 5 weeks postpartum following a spontaneous vaginal delivery perineal laceration with delivery, second degree at [redacted]w[redacted]d gestational weeks Date: 03/15/12 female baby named [name] delivered by VL, CNM.  Breastfeeding: yes Bottlefeeding:  Yes Bmilk Post-partum blues / depression: yes EPDS score: 10  History of abnormal Pap:  no  Last Pap: Date  "couple years ago" Gestational diabetes:  no  Contraception:  Desires condoms  Normal urinary function:  yes Normal GI function:  yes Returning to work:  Yes 1st or 2nd week of Feb.

## 2012-04-19 NOTE — Progress Notes (Signed)
Cynthia Love is a 27 y.o. female who presents for a postpartum visit.   Type of delivery:  SVB by VL--unmedicated birth, 2nd degree laceration.  Patient reports has struggled with some pp depression/anxiety, but feels this is resolving now.  Has had support from husband and family.  Denies SI/HI.  Declines meds.  Getting ready to RTW early February.  Hx remarkable for: Patient Active Problem List  Diagnosis  . Pregnancy, normal, incidental  . Hypothyroidism complicating pregnancy  . Vaginal delivery  . Perineal laceration with delivery, second degree     PPDS = 10--see above note.  Denies SI/HI, declines meds.  Contraception plan:  Condoms   I have fully reviewed the prenatal and intrapartum course   Patient has not been sexually active since delivery.   The following portions of the patient's history were reviewed and updated as appropriate: allergies, current medications, past family history, past medical history, past social history, past surgical history and problem list.  Review of Systems Pertinent items are noted in HPI.   Objective:    BP 120/68  Resp 16  Wt 180 lb (81.647 kg)  Breastfeeding? Yes  General:  alert, cooperative and no distress     Lungs: clear to auscultation bilaterally  Heart:  regular rate and rhythm, S1, S2 normal, no murmur  Abdomen: soft, non-tender; bowel sounds normal; no masses,  no organomegaly.   Incision:  NA   Vulva:  normal, well healed 2nd degree laceration  Vagina: normal vagina  Cervix:  normal  Uterus: normal size, contour, position, consistency, mobility, non-tender, well-involuted  Adnexa:  normal adnexa             Assessment:     Normal postpartum involution PP depression, now resolving. Pap smear:   not done at today's visit.  Due this year  Plan:  Follow-up at annual later this year. Lengthy discussion of PP depression s/s.  Patient is coping well with stresses of new motherhood and is feeling more competent  and confident.  Declines meds and therapy, but understands need to contact us if issues worsen.   Will continue to f/u with primary MD for thyroid management. Rxs:  None  Nigel Bridgeman CNM, [place] 04/19/2012 11:12 AM

## 2012-07-21 ENCOUNTER — Emergency Department (HOSPITAL_COMMUNITY)
Admission: EM | Admit: 2012-07-21 | Discharge: 2012-07-21 | Disposition: A | Discharge status: Home / Self Care | Payer: PRIVATE HEALTH INSURANCE | Attending: Emergency Medicine | Admitting: Emergency Medicine

## 2012-07-21 ENCOUNTER — Encounter (HOSPITAL_COMMUNITY): Payer: Self-pay | Admitting: Family Medicine

## 2012-07-21 DIAGNOSIS — Y9210 Unspecified residential institution as the place of occurrence of the external cause: Secondary | ICD-10-CM | POA: Insufficient documentation

## 2012-07-21 DIAGNOSIS — Z8679 Personal history of other diseases of the circulatory system: Secondary | ICD-10-CM | POA: Insufficient documentation

## 2012-07-21 DIAGNOSIS — R110 Nausea: Secondary | ICD-10-CM | POA: Insufficient documentation

## 2012-07-21 DIAGNOSIS — W19XXXA Unspecified fall, initial encounter: Secondary | ICD-10-CM

## 2012-07-21 DIAGNOSIS — W010XXA Fall on same level from slipping, tripping and stumbling without subsequent striking against object, initial encounter: Secondary | ICD-10-CM | POA: Insufficient documentation

## 2012-07-21 DIAGNOSIS — IMO0002 Reserved for concepts with insufficient information to code with codable children: Secondary | ICD-10-CM | POA: Insufficient documentation

## 2012-07-21 DIAGNOSIS — Z79899 Other long term (current) drug therapy: Secondary | ICD-10-CM | POA: Insufficient documentation

## 2012-07-21 DIAGNOSIS — Z87440 Personal history of urinary (tract) infections: Secondary | ICD-10-CM | POA: Insufficient documentation

## 2012-07-21 DIAGNOSIS — Y939 Activity, unspecified: Secondary | ICD-10-CM | POA: Insufficient documentation

## 2012-07-21 DIAGNOSIS — T148XXA Other injury of unspecified body region, initial encounter: Secondary | ICD-10-CM

## 2012-07-21 DIAGNOSIS — E039 Hypothyroidism, unspecified: Secondary | ICD-10-CM | POA: Insufficient documentation

## 2012-07-21 MED ORDER — IBUPROFEN 800 MG PO TABS: 800.0000 mg | ORAL_TABLET | Freq: Once | ORAL | Status: DC

## 2012-07-21 NOTE — ED Provider Notes (Signed)
Medical screening examination/treatment/procedure(s) were performed by non-physician practitioner and as supervising physician I was immediately available for consultation/collaboration.  Flint Melter, MD 07/21/12 [phone]

## 2012-07-21 NOTE — ED Notes (Signed)
Pt reports tripping and falling in parking lot earlier this morning. Denies LOC, alert and oriented x4. Pt presents with abrasion and edema to right forehead and small abrasion to right knee. Bleeding controlled.

## 2012-07-21 NOTE — ED Provider Notes (Signed)
History     CSN: [id number]  Arrival date & time 07/21/12  0704   First MD Initiated Contact with Patient 07/21/12 [phone]      Chief Complaint  Patient presents with  . Fall    (Consider location/radiation/quality/duration/timing/severity/associated sxs/prior treatment) HPI Comments: 27 year old female presents to the emergency department after tripping and falling in the parking lot of the hospital prior to arrival. Patient states she tripped over a wire and landed on her right knee, right hand in the right side of her forehead. Denies loss of consciousness. She obtained abrasions and has the bleeding controlled. States she feels a little bit sore around her right knee and her for head. Initially had nausea, however the symptoms have since subsided. Denies dizziness, lightheadedness, confusion, visual disturbance or vomiting. Pain in the right knee is described as a dull sting over where the abrasion is. She has not had any alleviating factors at this time her symptoms.  Patient is a 27 y.o. female presenting with fall. The history is provided by the patient.  Fall Associated symptoms include nausea (subsided).    Past Medical History  Diagnosis Date  . Hypothyroidism 2010  . Infection     UTI X 1  . Headache     MIGRAINE 1-2 X MONTH;  OTC MED    Past Surgical History  Procedure Laterality Date  . No past surgeries      Family History  Problem Relation Age of Onset  . Hashimoto's thyroiditis Mother   . Heart disease Father     MYCOCARDIAL INFARCTION  . Early death Father 72  . Hyperlipidemia Father   . Hypertension Father   . Kidney disease Father     DIALYSIS  . Schizophrenia Maternal Aunt   . Cancer Maternal Grandmother     PANCREATIC  . Cancer Maternal Grandfather     NON HODGKINS LYNMPHOMA  . Heart disease Paternal Grandmother   . Heart disease Paternal Grandfather     History  Substance Use Topics  . Smoking status: Never Smoker   . Smokeless tobacco:  Never Used  . Alcohol Use: No     Comment: 1-2 X/MONTH    OB History   Grav Para Term Preterm Abortions TAB SAB Ect Mult Living   2 1 1  1   1  1       Review of Systems  Eyes: Negative for visual disturbance.  Gastrointestinal: Positive for nausea (subsided).  Musculoskeletal: Negative for back pain.  Skin: Positive for wound.  Neurological: Negative for dizziness and light-headedness.  Psychiatric/Behavioral: Negative for confusion.  All other systems reviewed and are negative.    Allergies  Review of patient's allergies indicates no known allergies.  Home Medications   Current Outpatient Rx  Name  Route  Sig  Dispense  Refill  . fish oil-omega-3 fatty acids 1000 MG capsule   Oral   Take 1 capsule by mouth daily.         . Flaxseed, Linseed, (FLAX SEED OIL PO)   Oral   Take by mouth daily.         Marland Kitchen ibuprofen (ADVIL,MOTRIN) 600 MG tablet   Oral   Take 1 tablet (600 mg total) by mouth every 6 (six) hours as needed for pain.   30 tablet   0   . levothyroxine (SYNTHROID, LEVOTHROID) 150 MCG tablet   Oral   Take 150 mcg by mouth daily.         . Prenatal Vit-Fe Fumarate-FA (  PRENATAL MULTIVITAMIN) TABS   Oral   Take 1 tablet by mouth daily.           BP 138/87  Pulse 99  Temp(Src) 98.1 F (36.7 C)  Resp 18  SpO2 100%  Physical Exam  Nursing note and vitals reviewed. Constitutional: She is oriented to person, place, and time. She appears well-developed and well-nourished. No distress.  HENT:  Head: Normocephalic. Head is with abrasion. Head is without raccoon's eyes, without Battle's sign and without laceration.    Mouth/Throat: Uvula is midline, oropharynx is clear and moist and mucous membranes are normal.  Eyes: Conjunctivae and EOM are normal. Pupils are equal, round, and reactive to light.  Neck: Normal range of motion. Neck supple.  Cardiovascular: Normal rate, regular rhythm, normal heart sounds and intact distal pulses.   Capillary  refill < 3 seconds.  Pulmonary/Chest: Effort normal and breath sounds normal. No respiratory distress.  Musculoskeletal: Normal range of motion.  Full ROM of right knee. Mild TTP over abrasion. Full ROM of right hand and wrist. No TTP.   Neurological: She is alert and oriented to person, place, and time. She has normal strength. She displays a negative Romberg sign. Coordination and gait normal.  Skin: Skin is warm and dry. Abrasion noted. She is not diaphoretic.     < 1 cm abrasion to palmar aspect of right hand near base of thumb. 1.5 cm abrasion to right knee with mild surrounding edema. 1 cm abrasion to right side of forehead with mild edema.  Psychiatric: She has a normal mood and affect. Her speech is normal and behavior is normal.    ED Course  Procedures (including critical care time)  Labs Reviewed - No data to display No results found.   1. Fall, initial encounter   2. Abrasion       MDM  27 y/o female with 3 abrasions s/p falling in the parking lot. No LOC. No red flags concerning patient's head injury. Neurovascularly intact. No focal neuro deficits. Ambulating without difficulty. She is in NAD. Full ROM of all extremities. Wound care given along with ibuprofen. Advised rest, ice, ibuprofen. Close return precautions discussed regarding hitting her head. Patient states understanding of plan and is agreeable.        Trevor Mace, PA-C 07/21/12 [phone]

## 2013-01-09 ENCOUNTER — Other Ambulatory Visit: Payer: Self-pay | Admitting: Obstetrics and Gynecology

## 2013-01-09 DIAGNOSIS — N912 Amenorrhea, unspecified: Secondary | ICD-10-CM

## 2013-01-09 DIAGNOSIS — R7989 Other specified abnormal findings of blood chemistry: Secondary | ICD-10-CM

## 2013-01-17 ENCOUNTER — Ambulatory Visit
Admission: RE | Admit: 2013-01-17 | Discharge: 2013-01-17 | Disposition: A | Discharge status: Home / Self Care | Payer: 59 | Source: Ambulatory Visit | Attending: Obstetrics and Gynecology | Admitting: Obstetrics and Gynecology

## 2013-01-17 DIAGNOSIS — R7989 Other specified abnormal findings of blood chemistry: Secondary | ICD-10-CM

## 2013-01-17 DIAGNOSIS — N912 Amenorrhea, unspecified: Secondary | ICD-10-CM

## 2013-01-17 MED ORDER — GADOBENATE DIMEGLUMINE 529 MG/ML IV SOLN
8.0000 mL | Freq: Once | INTRAVENOUS | Status: AC | PRN
  Administered 2013-01-17: 8 mL via INTRAVENOUS

## 2013-01-26 ENCOUNTER — Other Ambulatory Visit: Payer: Self-pay

## 2013-11-01 ENCOUNTER — Other Ambulatory Visit: Payer: Self-pay | Admitting: Internal Medicine

## 2013-11-01 DIAGNOSIS — R229 Localized swelling, mass and lump, unspecified: Principal | ICD-10-CM

## 2013-11-01 DIAGNOSIS — IMO0002 Reserved for concepts with insufficient information to code with codable children: Secondary | ICD-10-CM

## 2014-01-22 ENCOUNTER — Encounter (HOSPITAL_COMMUNITY): Payer: Self-pay | Admitting: Family Medicine

## 2014-02-07 ENCOUNTER — Ambulatory Visit
Admission: RE | Admit: 2014-02-07 | Discharge: 2014-02-07 | Disposition: A | Discharge status: Home / Self Care | Payer: 59 | Source: Ambulatory Visit | Attending: Internal Medicine | Admitting: Internal Medicine

## 2014-02-07 DIAGNOSIS — IMO0002 Reserved for concepts with insufficient information to code with codable children: Secondary | ICD-10-CM

## 2014-02-07 DIAGNOSIS — R229 Localized swelling, mass and lump, unspecified: Principal | ICD-10-CM

## 2014-02-07 MED ORDER — GADOBENATE DIMEGLUMINE 529 MG/ML IV SOLN
10.0000 mL | Freq: Once | INTRAVENOUS | Status: AC | PRN
  Administered 2014-02-07: 10 mL via INTRAVENOUS

## 2015-02-18 ENCOUNTER — Other Ambulatory Visit: Payer: Self-pay | Admitting: Internal Medicine

## 2015-02-18 DIAGNOSIS — D353 Benign neoplasm of craniopharyngeal duct: Principal | ICD-10-CM

## 2015-02-18 DIAGNOSIS — D352 Benign neoplasm of pituitary gland: Secondary | ICD-10-CM

## 2015-02-27 ENCOUNTER — Ambulatory Visit (HOSPITAL_COMMUNITY): Payer: 59

## 2015-03-20 ENCOUNTER — Ambulatory Visit (HOSPITAL_COMMUNITY): Payer: 59

## 2015-03-28 ENCOUNTER — Ambulatory Visit (HOSPITAL_COMMUNITY)
Admission: RE | Admit: 2015-03-28 | Discharge: 2015-03-28 | Disposition: A | Discharge status: Home / Self Care | Payer: 59 | Source: Ambulatory Visit | Attending: Internal Medicine | Admitting: Internal Medicine

## 2015-03-28 DIAGNOSIS — D352 Benign neoplasm of pituitary gland: Secondary | ICD-10-CM | POA: Diagnosis not present

## 2015-03-28 DIAGNOSIS — D353 Benign neoplasm of craniopharyngeal duct: Secondary | ICD-10-CM

## 2015-03-28 MED ORDER — GADOBENATE DIMEGLUMINE 529 MG/ML IV SOLN: 10.0000 mL | Freq: Once | INTRAVENOUS | Status: DC | PRN

## 2015-04-04 MED FILL — SYNTHROID 137 MCG TABLET: 137 | 30 days supply | Qty: 30 | Fill #9

## 2015-05-03 MED FILL — SYNTHROID 137 MCG TABLET: 137 | 90 days supply | Qty: 90 | Fill #0

## 2015-05-17 DIAGNOSIS — H52223 Regular astigmatism, bilateral: Secondary | ICD-10-CM | POA: Diagnosis not present

## 2015-05-17 DIAGNOSIS — H5213 Myopia, bilateral: Secondary | ICD-10-CM | POA: Diagnosis not present

## 2015-07-11 ENCOUNTER — Emergency Department (HOSPITAL_BASED_OUTPATIENT_CLINIC_OR_DEPARTMENT_OTHER): Payer: 59

## 2015-07-11 ENCOUNTER — Emergency Department (HOSPITAL_BASED_OUTPATIENT_CLINIC_OR_DEPARTMENT_OTHER)
Admission: EM | Admit: 2015-07-11 | Discharge: 2015-07-11 | Disposition: A | Discharge status: Home / Self Care | Payer: 59 | Attending: Emergency Medicine | Admitting: Emergency Medicine

## 2015-07-11 ENCOUNTER — Encounter (HOSPITAL_BASED_OUTPATIENT_CLINIC_OR_DEPARTMENT_OTHER): Payer: Self-pay | Admitting: *Deleted

## 2015-07-11 DIAGNOSIS — Z79899 Other long term (current) drug therapy: Secondary | ICD-10-CM | POA: Diagnosis not present

## 2015-07-11 DIAGNOSIS — W010XXA Fall on same level from slipping, tripping and stumbling without subsequent striking against object, initial encounter: Secondary | ICD-10-CM | POA: Diagnosis not present

## 2015-07-11 DIAGNOSIS — M79671 Pain in right foot: Secondary | ICD-10-CM | POA: Diagnosis not present

## 2015-07-11 DIAGNOSIS — Y9289 Other specified places as the place of occurrence of the external cause: Secondary | ICD-10-CM | POA: Diagnosis not present

## 2015-07-11 DIAGNOSIS — S9031XA Contusion of right foot, initial encounter: Secondary | ICD-10-CM | POA: Insufficient documentation

## 2015-07-11 DIAGNOSIS — Z3202 Encounter for pregnancy test, result negative: Secondary | ICD-10-CM | POA: Diagnosis not present

## 2015-07-11 DIAGNOSIS — S99911A Unspecified injury of right ankle, initial encounter: Secondary | ICD-10-CM | POA: Diagnosis not present

## 2015-07-11 DIAGNOSIS — M7989 Other specified soft tissue disorders: Secondary | ICD-10-CM | POA: Diagnosis not present

## 2015-07-11 DIAGNOSIS — E039 Hypothyroidism, unspecified: Secondary | ICD-10-CM | POA: Diagnosis not present

## 2015-07-11 DIAGNOSIS — Z87440 Personal history of urinary (tract) infections: Secondary | ICD-10-CM | POA: Diagnosis not present

## 2015-07-11 DIAGNOSIS — Y9389 Activity, other specified: Secondary | ICD-10-CM | POA: Diagnosis not present

## 2015-07-11 DIAGNOSIS — Y998 Other external cause status: Secondary | ICD-10-CM | POA: Diagnosis not present

## 2015-07-11 DIAGNOSIS — Z8679 Personal history of other diseases of the circulatory system: Secondary | ICD-10-CM | POA: Insufficient documentation

## 2015-07-11 DIAGNOSIS — S99921A Unspecified injury of right foot, initial encounter: Secondary | ICD-10-CM | POA: Diagnosis not present

## 2015-07-11 DIAGNOSIS — M25571 Pain in right ankle and joints of right foot: Secondary | ICD-10-CM | POA: Diagnosis not present

## 2015-07-11 LAB — PREGNANCY, URINE: PREG TEST UR: NEGATIVE

## 2015-07-11 MED ORDER — IBUPROFEN 400 MG PO TABS
600.0000 mg | ORAL_TABLET | Freq: Once | ORAL | Status: AC
  Administered 2015-07-11: 600 mg via ORAL
  Filled 2015-07-11: qty 1

## 2015-07-11 MED ORDER — KETOROLAC TROMETHAMINE 60 MG/2ML IM SOLN: 60.0000 mg | Freq: Once | INTRAMUSCULAR | Status: DC

## 2015-07-11 NOTE — ED Notes (Signed)
Pt reports trip and fall this am twisting her right ankle, mild swelling and discoloration to outer aspect, decreased rom due to pain. Denies any other c/o or injuries.

## 2015-07-11 NOTE — Discharge Instructions (Signed)
You have been seen today for a foot injury. Your imaging showed no abnormalities. Take ibuprofen or naproxen for pain and inflammation. Elevate the extremity whenever possible. Apply ice for no more than 15 minutes at a time. Use the postop shoe and crutches for comfort. Follow up with PCP as needed should symptoms continue. Return to ED should symptoms worsen.

## 2015-07-11 NOTE — ED Provider Notes (Signed)
CSN: [id number]     Arrival date & time 07/11/15  1025 History   First MD Initiated Contact with Patient 07/11/15 1040     Chief Complaint  Patient presents with  . Foot Pain     (Consider location/radiation/quality/duration/timing/severity/associated sxs/prior Treatment) HPI   Cynthia Love is a 30 y.o. female, with a history of hypothyroidism, presenting to the ED with right foot and ankle injury that occurred this morning. Patient states that she was walking down the stairs, missed stepped on an object sitting on the stairs, and inverted her right ankle. Patient states she had immediate pain. Patient did not fall, hit her head, or lose consciousness. Patient currently rates her pain at 4 out of 10, throbbing, nonradiating. Pain goes up to 6 out of 10 with palpation or movement. Patient has not taken anything for pain. Patient denies neuro deficits or any other complaints.     Past Medical History  Diagnosis Date  . Hypothyroidism 2010  . Infection     UTI X 1  . Headache(784.0)     MIGRAINE 1-2 X MONTH;  OTC MED   Past Surgical History  Procedure Laterality Date  . No past surgeries     Family History  Problem Relation Age of Onset  . Hashimoto's thyroiditis Mother   . Heart disease Father     MYCOCARDIAL INFARCTION  . Early death Father 89  . Hyperlipidemia Father   . Hypertension Father   . Kidney disease Father     DIALYSIS  . Schizophrenia Maternal Aunt   . Cancer Maternal Grandmother     PANCREATIC  . Cancer Maternal Grandfather     NON HODGKINS LYNMPHOMA  . Heart disease Paternal Grandmother   . Heart disease Paternal Grandfather    Social History  Substance Use Topics  . Smoking status: Never Smoker   . Smokeless tobacco: Never Used  . Alcohol Use: No     Comment: 1-2 X/MONTH   OB History    Gravida Para Term Preterm AB TAB SAB Ectopic Multiple Living   2 1 1  1   1  1      Review of Systems  Musculoskeletal: Positive for joint swelling (right  foot and ankle) and arthralgias (right foot and ankle).  Skin: Negative for wound.  Neurological: Negative for weakness and numbness.      Allergies  Review of patient's allergies indicates no known allergies.  Home Medications   Prior to Admission medications   Medication Sig Start Date End Date Taking? Authorizing Provider  fish oil-omega-3 fatty acids 1000 MG capsule Take 1 capsule by mouth daily.    Historical Provider, MD  Flaxseed, Linseed, (FLAX SEED OIL PO) Take by mouth daily.    Historical Provider, MD  ibuprofen (ADVIL,MOTRIN) 600 MG tablet Take 1 tablet (600 mg total) by mouth every 6 (six) hours as needed for pain. 03/17/12   Hetty Ely, CNM  levothyroxine (SYNTHROID, LEVOTHROID) 150 MCG tablet Take 150 mcg by mouth daily.    Historical Provider, MD  Prenatal Vit-Fe Fumarate-FA (PRENATAL MULTIVITAMIN) TABS Take 1 tablet by mouth daily.    Historical Provider, MD   BP 130/85 mmHg  Pulse 84  Temp(Src) 98.3 F (36.8 C) (Oral)  Resp 16  Ht 5\' 2"  (1.575 m)  Wt 77.111 kg  BMI 31.09 kg/m2  SpO2 100%  LMP 06/23/2015 Physical Exam  Constitutional: She appears well-developed and well-nourished. No distress.  HENT:  Head: Normocephalic and atraumatic.  Eyes: Conjunctivae are normal.  Cardiovascular: Normal rate, regular rhythm and intact distal pulses.   Pulmonary/Chest: Effort normal.  Musculoskeletal:  Tenderness, bruising, and swelling noted to the dorsum of the right foot and lateral malleolus. Range of motion limited by pain.  Neurological: She is alert.  No sensory deficits distally in the right foot or toes. Strength 5 out of 5 in the right toes.  Skin: Skin is warm and dry. She is not diaphoretic.  Nursing note and vitals reviewed.   ED Course  Procedures (including critical care time) Labs Review Labs Reviewed  PREGNANCY, URINE    Imaging Review Dg Ankle Complete Right  07/11/2015  CLINICAL DATA:  [place] down steps this morning. Injured right ankle  and foot. EXAM: RIGHT FOOT COMPLETE - 3+ VIEW; RIGHT ANKLE - COMPLETE 3+ VIEW COMPARISON:  None. FINDINGS: Right ankle: The ankle mortise is normal. No acute ankle fracture or osteochondral abnormality. No ankle joint effusion. Os trigonum noted. Right foot: The joint spaces are maintained. Possible subtle fractures of the middle and lateral cuneiforms on the oblique film. CT may be helpful for further evaluation. IMPRESSION: No acute ankle fracture. Possible subtle cuneiform fractures. Recommend CT for further evaluation. Electronically Signed   By: Marijo Sanes M.D.   On: 07/11/2015 11:49   Ct Ankle Right Wo Contrast  07/11/2015  CLINICAL DATA:  Right foot and ankle pain and swelling after falling. Possible cuneiform fractures on radiographs. Initial encounter. EXAM: CT OF THE RIGHT ANKLE WITHOUT CONTRAST CT OF THE RIGHT FOOT WITHOUT CONTRAST TECHNIQUE: Multidetector CT imaging of the right ankle was performed according to the standard protocol. Multiplanar CT image reconstructions were also generated. COMPARISON:  Radiograph same day. FINDINGS: The mineralization alignment are normal. There is no evidence of acute fracture or dislocation. Specifically, the cuneiform bones appear normal. The alignment is normal at the Lisfranc joint. Patient does have a prominent os trigonum which has sclerotic margins. There is no surrounding soft tissue edema or significant ankle joint effusion. A small amount of fluid is present within the flexor hallucis longus tendon sheath, but no intrinsic tendon abnormality identified. The additional ankle tendons appear normal. The lateral ankle ligaments appear intact. There is some subcutaneous edema in the plantar aspect of the hindfoot and forefoot which could be posttraumatic. IMPRESSION: 1. No evidence of acute fracture or dislocation. The cuneiform bones appear intact. 2. Prominent os trigonum, likely incidental. 3. Nonspecific mild subcutaneous edema in the hindfoot and  forefoot. Electronically Signed   By: Richardean Sale M.D.   On: 07/11/2015 13:17   Ct Foot Right Wo Contrast  07/11/2015  CLINICAL DATA:  Right foot and ankle pain and swelling after falling. Possible cuneiform fractures on radiographs. Initial encounter. EXAM: CT OF THE RIGHT ANKLE WITHOUT CONTRAST CT OF THE RIGHT FOOT WITHOUT CONTRAST TECHNIQUE: Multidetector CT imaging of the right ankle was performed according to the standard protocol. Multiplanar CT image reconstructions were also generated. COMPARISON:  Radiograph same day. FINDINGS: The mineralization alignment are normal. There is no evidence of acute fracture or dislocation. Specifically, the cuneiform bones appear normal. The alignment is normal at the Lisfranc joint. Patient does have a prominent os trigonum which has sclerotic margins. There is no surrounding soft tissue edema or significant ankle joint effusion. A small amount of fluid is present within the flexor hallucis longus tendon sheath, but no intrinsic tendon abnormality identified. The additional ankle tendons appear normal. The lateral ankle ligaments appear intact. There is some subcutaneous edema in the plantar aspect of the  hindfoot and forefoot which could be posttraumatic. IMPRESSION: 1. No evidence of acute fracture or dislocation. The cuneiform bones appear intact. 2. Prominent os trigonum, likely incidental. 3. Nonspecific mild subcutaneous edema in the hindfoot and forefoot. Electronically Signed   By: Richardean Sale M.D.   On: 07/11/2015 13:17   Dg Foot Complete Right  07/11/2015  CLINICAL DATA:  [place] down steps this morning. Injured right ankle and foot. EXAM: RIGHT FOOT COMPLETE - 3+ VIEW; RIGHT ANKLE - COMPLETE 3+ VIEW COMPARISON:  None. FINDINGS: Right ankle: The ankle mortise is normal. No acute ankle fracture or osteochondral abnormality. No ankle joint effusion. Os trigonum noted. Right foot: The joint spaces are maintained. Possible subtle fractures of the middle and  lateral cuneiforms on the oblique film. CT may be helpful for further evaluation. IMPRESSION: No acute ankle fracture. Possible subtle cuneiform fractures. Recommend CT for further evaluation. Electronically Signed   By: Marijo Sanes M.D.   On: 07/11/2015 11:49   I have personally reviewed and evaluated these images as part of my medical decision-making.   EKG Interpretation None      MDM   Final diagnoses:  Right foot pain    JYLA RHEAD presents with a right foot and ankle injury that occurred just prior to arrival.  X-ray shows some question of a possible cuneiform fracture. CT recommended. Patient offered pain management, but declined. CT shows no evidence of fracture or dislocation. Patient placed in postop shoe and given crutches. Patient follow-up with PCP should symptoms continue. Home care and return precautions discussed. Patient voiced understanding of these instructions and is comfortable with discharge.   Filed Vitals:   07/11/15 1034  BP: 130/85  Pulse: 84  Temp: 98.3 F (36.8 C)  TempSrc: Oral  Resp: 16  Height: 5\' 2"  (1.575 m)  Weight: 77.111 kg  SpO2: 100%       Lorayne Bender, PA-C 07/11/15 [place], MD 07/11/15 1604

## 2015-07-11 NOTE — ED Notes (Signed)
Patient transported to & from radiology department via wheelchair.

## 2015-07-17 DIAGNOSIS — D353 Benign neoplasm of craniopharyngeal duct: Secondary | ICD-10-CM | POA: Diagnosis not present

## 2015-07-17 DIAGNOSIS — E039 Hypothyroidism, unspecified: Secondary | ICD-10-CM | POA: Diagnosis not present

## 2015-08-02 MED FILL — SYNTHROID 137 MCG TABLET: 137 | 90 days supply | Qty: 90 | Fill #1

## 2015-08-15 DIAGNOSIS — Z3491 Encounter for supervision of normal pregnancy, unspecified, first trimester: Secondary | ICD-10-CM | POA: Diagnosis not present

## 2015-08-20 LAB — OB RESULTS CONSOLE GC/CHLAMYDIA
Chlamydia: NEGATIVE
Gonorrhea: NEGATIVE

## 2015-08-20 LAB — OB RESULTS CONSOLE RPR: RPR: NONREACTIVE

## 2015-08-20 LAB — OB RESULTS CONSOLE HIV ANTIBODY (ROUTINE TESTING): HIV: NONREACTIVE

## 2015-10-01 DIAGNOSIS — E039 Hypothyroidism, unspecified: Secondary | ICD-10-CM | POA: Diagnosis not present

## 2015-10-14 DIAGNOSIS — D353 Benign neoplasm of craniopharyngeal duct: Secondary | ICD-10-CM | POA: Diagnosis not present

## 2015-10-14 DIAGNOSIS — E039 Hypothyroidism, unspecified: Secondary | ICD-10-CM | POA: Diagnosis not present

## 2015-11-13 DIAGNOSIS — Z3A20 20 weeks gestation of pregnancy: Secondary | ICD-10-CM | POA: Diagnosis not present

## 2015-11-13 DIAGNOSIS — Z36 Encounter for antenatal screening of mother: Secondary | ICD-10-CM | POA: Diagnosis not present

## 2015-11-13 DIAGNOSIS — Z3492 Encounter for supervision of normal pregnancy, unspecified, second trimester: Secondary | ICD-10-CM | POA: Diagnosis not present

## 2015-11-13 MED FILL — SYNTHROID 150 MCG TABLET: 150 | 30 days supply | Qty: 30 | Fill #0

## 2015-11-18 DIAGNOSIS — E039 Hypothyroidism, unspecified: Secondary | ICD-10-CM | POA: Diagnosis not present

## 2015-11-18 DIAGNOSIS — D353 Benign neoplasm of craniopharyngeal duct: Secondary | ICD-10-CM | POA: Diagnosis not present

## 2015-11-18 DIAGNOSIS — E221 Hyperprolactinemia: Secondary | ICD-10-CM | POA: Diagnosis not present

## 2015-11-21 MED FILL — SYNTHROID 175 MCG TABLET: 175 | 60 days supply | Qty: 60 | Fill #0

## 2016-01-01 DIAGNOSIS — E039 Hypothyroidism, unspecified: Secondary | ICD-10-CM | POA: Diagnosis not present

## 2016-01-01 DIAGNOSIS — D353 Benign neoplasm of craniopharyngeal duct: Secondary | ICD-10-CM | POA: Diagnosis not present

## 2016-01-01 DIAGNOSIS — N921 Excessive and frequent menstruation with irregular cycle: Secondary | ICD-10-CM | POA: Diagnosis not present

## 2016-01-08 DIAGNOSIS — Z3483 Encounter for supervision of other normal pregnancy, third trimester: Secondary | ICD-10-CM | POA: Diagnosis not present

## 2016-01-08 DIAGNOSIS — Z3A28 28 weeks gestation of pregnancy: Secondary | ICD-10-CM | POA: Diagnosis not present

## 2016-01-08 DIAGNOSIS — Z369 Encounter for antenatal screening, unspecified: Secondary | ICD-10-CM | POA: Diagnosis not present

## 2016-01-08 DIAGNOSIS — Z23 Encounter for immunization: Secondary | ICD-10-CM | POA: Diagnosis not present

## 2016-01-15 MED FILL — SYNTHROID 175 MCG TABLET: 175 | 60 days supply | Qty: 60 | Fill #1

## 2016-02-12 DIAGNOSIS — E039 Hypothyroidism, unspecified: Secondary | ICD-10-CM | POA: Diagnosis not present

## 2016-02-12 DIAGNOSIS — D353 Benign neoplasm of craniopharyngeal duct: Secondary | ICD-10-CM | POA: Diagnosis not present

## 2016-03-06 DIAGNOSIS — Z3A36 36 weeks gestation of pregnancy: Secondary | ICD-10-CM | POA: Diagnosis not present

## 2016-03-06 DIAGNOSIS — Z3493 Encounter for supervision of normal pregnancy, unspecified, third trimester: Secondary | ICD-10-CM | POA: Diagnosis not present

## 2016-03-06 DIAGNOSIS — Z369 Encounter for antenatal screening, unspecified: Secondary | ICD-10-CM | POA: Diagnosis not present

## 2016-03-06 LAB — OB RESULTS CONSOLE GBS: GBS: POSITIVE

## 2016-03-12 DIAGNOSIS — O99113 Other diseases of the blood and blood-forming organs and certain disorders involving the immune mechanism complicating pregnancy, third trimester: Secondary | ICD-10-CM | POA: Diagnosis not present

## 2016-03-18 MED FILL — SYNTHROID 175 MCG TABLET: 175 | 60 days supply | Qty: 60 | Fill #2

## 2016-03-23 NOTE — L&D Delivery Note (Signed)
Delivery Note  At 1:01 PM a viable female,named Cynthia Love, was delivered via Vaginal, Spontaneous Delivery (Presentation: ROA).  APGAR:9-9  Placenta status: spontaneous, complete with 3 vx Cord  Anesthesia:  none Episiotomy:  none Lacerations:  none  Est. Blood Loss (mL):  200 cc  Mom to postpartum.  Baby to Couplet care / Skin to Skin. Breastfeeding and inpatient circumcision planned.  [name] A 03/31/2016, 1:40 PM

## 2016-03-26 DIAGNOSIS — D696 Thrombocytopenia, unspecified: Secondary | ICD-10-CM | POA: Diagnosis not present

## 2016-03-31 ENCOUNTER — Encounter (HOSPITAL_COMMUNITY): Payer: Self-pay | Admitting: *Deleted

## 2016-03-31 ENCOUNTER — Inpatient Hospital Stay (HOSPITAL_COMMUNITY)
Admission: AD | Admit: 2016-03-31 | Discharge: 2016-04-02 | DRG: 775 | Disposition: A | Discharge status: Home / Self Care | Payer: 59 | Source: Ambulatory Visit | Attending: Obstetrics and Gynecology | Admitting: Obstetrics and Gynecology

## 2016-03-31 DIAGNOSIS — Z3A40 40 weeks gestation of pregnancy: Secondary | ICD-10-CM | POA: Diagnosis not present

## 2016-03-31 DIAGNOSIS — O98813 Other maternal infectious and parasitic diseases complicating pregnancy, third trimester: Secondary | ICD-10-CM | POA: Diagnosis not present

## 2016-03-31 DIAGNOSIS — Z3493 Encounter for supervision of normal pregnancy, unspecified, third trimester: Secondary | ICD-10-CM | POA: Diagnosis present

## 2016-03-31 DIAGNOSIS — Z3A00 Weeks of gestation of pregnancy not specified: Secondary | ICD-10-CM | POA: Diagnosis not present

## 2016-03-31 DIAGNOSIS — O99824 Streptococcus B carrier state complicating childbirth: Principal | ICD-10-CM | POA: Diagnosis present

## 2016-03-31 DIAGNOSIS — D6959 Other secondary thrombocytopenia: Secondary | ICD-10-CM | POA: Diagnosis present

## 2016-03-31 DIAGNOSIS — O99284 Endocrine, nutritional and metabolic diseases complicating childbirth: Secondary | ICD-10-CM | POA: Diagnosis present

## 2016-03-31 DIAGNOSIS — Z8249 Family history of ischemic heart disease and other diseases of the circulatory system: Secondary | ICD-10-CM

## 2016-03-31 DIAGNOSIS — E039 Hypothyroidism, unspecified: Secondary | ICD-10-CM | POA: Diagnosis present

## 2016-03-31 DIAGNOSIS — O9912 Other diseases of the blood and blood-forming organs and certain disorders involving the immune mechanism complicating childbirth: Secondary | ICD-10-CM | POA: Diagnosis present

## 2016-03-31 LAB — CBC
HCT: 35.7 % — ABNORMAL LOW (ref 36.0–46.0)
Hemoglobin: 12.3 g/dL (ref 12.0–15.0)
MCH: 28.9 pg (ref 26.0–34.0)
MCHC: 34.5 g/dL (ref 30.0–36.0)
MCV: 83.8 fL (ref 78.0–100.0)
PLATELETS: 130 10*3/uL — AB (ref 150–400)
RBC: 4.26 MIL/uL (ref 3.87–5.11)
RDW: 13.3 % (ref 11.5–15.5)
WBC: 12.4 10*3/uL — AB (ref 4.0–10.5)

## 2016-03-31 LAB — TYPE AND SCREEN
ABO/RH(D): AB POS
Antibody Screen: NEGATIVE

## 2016-03-31 LAB — ABO/RH: ABO/RH(D): AB POS

## 2016-03-31 MED ORDER — OXYCODONE-ACETAMINOPHEN 5-325 MG PO TABS: 2.0000 | ORAL_TABLET | ORAL | Status: DC | PRN

## 2016-03-31 MED ORDER — SODIUM CHLORIDE 0.9 % IV SOLN
2.0000 g | Freq: Once | INTRAVENOUS | Status: AC
  Administered 2016-03-31: 2 g via INTRAVENOUS
  Filled 2016-03-31: qty 2000

## 2016-03-31 MED ORDER — ONDANSETRON HCL 4 MG/2ML IJ SOLN: 4.0000 mg | INTRAMUSCULAR | Status: DC | PRN

## 2016-03-31 MED ORDER — FERROUS SULFATE 325 (65 FE) MG PO TABS
325.0000 mg | ORAL_TABLET | Freq: Two times a day (BID) | ORAL | Status: DC
  Administered 2016-03-31 – 2016-04-02 (×4): 325 mg via ORAL
  Filled 2016-03-31 (×4): qty 1

## 2016-03-31 MED ORDER — OXYCODONE-ACETAMINOPHEN 5-325 MG PO TABS: 1.0000 | ORAL_TABLET | ORAL | Status: DC | PRN

## 2016-03-31 MED ORDER — TETANUS-DIPHTH-ACELL PERTUSSIS 5-2.5-18.5 LF-MCG/0.5 IM SUSP: 0.5000 mL | Freq: Once | INTRAMUSCULAR | Status: DC

## 2016-03-31 MED ORDER — SIMETHICONE 80 MG PO CHEW: 80.0000 mg | CHEWABLE_TABLET | ORAL | Status: DC | PRN

## 2016-03-31 MED ORDER — ACETAMINOPHEN 325 MG PO TABS: 650.0000 mg | ORAL_TABLET | ORAL | Status: DC | PRN

## 2016-03-31 MED ORDER — LACTATED RINGERS IV SOLN: 500.0000 mL | INTRAVENOUS | Status: DC | PRN

## 2016-03-31 MED ORDER — MEASLES, MUMPS & RUBELLA VAC ~~LOC~~ INJ: 0.5000 mL | INJECTION | Freq: Once | SUBCUTANEOUS | Status: DC

## 2016-03-31 MED ORDER — SOD CITRATE-CITRIC ACID 500-334 MG/5ML PO SOLN: 30.0000 mL | ORAL | Status: DC | PRN

## 2016-03-31 MED ORDER — COCONUT OIL OIL
1.0000 "application " | TOPICAL_OIL | Status: DC | PRN
  Administered 2016-04-01: 1 via TOPICAL
  Filled 2016-03-31: qty 120

## 2016-03-31 MED ORDER — OXYTOCIN BOLUS FROM INFUSION: 500.0000 mL | Freq: Once | INTRAVENOUS | Status: DC

## 2016-03-31 MED ORDER — ONDANSETRON HCL 4 MG PO TABS: 4.0000 mg | ORAL_TABLET | ORAL | Status: DC | PRN

## 2016-03-31 MED ORDER — AMPICILLIN SODIUM 2 G IJ SOLR
2.0000 g | Freq: Once | INTRAMUSCULAR | Status: DC
  Filled 2016-03-31: qty 2000

## 2016-03-31 MED ORDER — ACETAMINOPHEN 325 MG PO TABS
650.0000 mg | ORAL_TABLET | ORAL | Status: DC | PRN
Start: 2016-03-31 — End: 2016-04-02
  Administered 2016-03-31 – 2016-04-01 (×2): 650 mg via ORAL
  Filled 2016-03-31 (×2): qty 2

## 2016-03-31 MED ORDER — ONDANSETRON HCL 4 MG/2ML IJ SOLN: 4.0000 mg | Freq: Four times a day (QID) | INTRAMUSCULAR | Status: DC | PRN

## 2016-03-31 MED ORDER — IBUPROFEN 600 MG PO TABS
600.0000 mg | ORAL_TABLET | Freq: Four times a day (QID) | ORAL | Status: DC
  Administered 2016-03-31 – 2016-04-02 (×8): 600 mg via ORAL
  Filled 2016-03-31 (×8): qty 1

## 2016-03-31 MED ORDER — OXYTOCIN 40 UNITS IN LACTATED RINGERS INFUSION - SIMPLE MED
2.5000 [IU]/h | INTRAVENOUS | Status: DC
  Filled 2016-03-31: qty 1000

## 2016-03-31 MED ORDER — DIPHENHYDRAMINE HCL 25 MG PO CAPS: 25.0000 mg | ORAL_CAPSULE | Freq: Four times a day (QID) | ORAL | Status: DC | PRN

## 2016-03-31 MED ORDER — WITCH HAZEL-GLYCERIN EX PADS: 1.0000 "application " | MEDICATED_PAD | CUTANEOUS | Status: DC | PRN

## 2016-03-31 MED ORDER — SENNOSIDES-DOCUSATE SODIUM 8.6-50 MG PO TABS
2.0000 | ORAL_TABLET | ORAL | Status: DC
  Administered 2016-03-31 – 2016-04-01 (×2): 2 via ORAL
  Filled 2016-03-31 (×2): qty 2

## 2016-03-31 MED ORDER — LACTATED RINGERS IV SOLN
INTRAVENOUS | Status: DC
  Administered 2016-03-31: 11:00:00 via INTRAVENOUS

## 2016-03-31 MED ORDER — ZOLPIDEM TARTRATE 5 MG PO TABS: 5.0000 mg | ORAL_TABLET | Freq: Every evening | ORAL | Status: DC | PRN

## 2016-03-31 MED ORDER — SODIUM CHLORIDE 0.9 % IV SOLN
2.0000 g | Freq: Once | INTRAVENOUS | Status: DC
  Filled 2016-03-31: qty 2000

## 2016-03-31 MED ORDER — PRENATAL MULTIVITAMIN CH
1.0000 | ORAL_TABLET | Freq: Every day | ORAL | Status: DC
  Administered 2016-04-01 – 2016-04-02 (×2): 1 via ORAL
  Filled 2016-03-31 (×2): qty 1

## 2016-03-31 MED ORDER — LIDOCAINE HCL (PF) 1 % IJ SOLN
30.0000 mL | INTRAMUSCULAR | Status: DC | PRN
  Filled 2016-03-31: qty 30

## 2016-03-31 MED ORDER — LIDOCAINE HCL (PF) 1 % IJ SOLN: 30.0000 mL | INTRAMUSCULAR | Status: DC | PRN

## 2016-03-31 MED ORDER — DIBUCAINE 1 % RE OINT: 1.0000 "application " | TOPICAL_OINTMENT | RECTAL | Status: DC | PRN

## 2016-03-31 MED ORDER — LACTATED RINGERS IV SOLN: INTRAVENOUS | Status: DC

## 2016-03-31 MED ORDER — BENZOCAINE-MENTHOL 20-0.5 % EX AERO
1.0000 "application " | INHALATION_SPRAY | CUTANEOUS | Status: DC | PRN
  Administered 2016-03-31: 1 via TOPICAL
  Filled 2016-03-31: qty 56

## 2016-03-31 MED ORDER — LEVOTHYROXINE SODIUM 175 MCG PO TABS
175.0000 ug | ORAL_TABLET | Freq: Every day | ORAL | Status: DC
  Administered 2016-04-01 – 2016-04-02 (×2): 175 ug via ORAL
  Filled 2016-03-31 (×3): qty 1

## 2016-03-31 MED ORDER — OXYTOCIN BOLUS FROM INFUSION
500.0000 mL | Freq: Once | INTRAVENOUS | Status: AC
  Administered 2016-03-31: 500 mL via INTRAVENOUS

## 2016-03-31 MED ORDER — OXYTOCIN 40 UNITS IN LACTATED RINGERS INFUSION - SIMPLE MED: 2.5000 [IU]/h | INTRAVENOUS | Status: DC

## 2016-03-31 NOTE — MAU Note (Signed)
Pt presents to MAU with complaints of contractions that started around 4 am with some mucous discharge. Denies LOF

## 2016-03-31 NOTE — H&P (Signed)
Cynthia Love is a 31 y.o. female , G3P1 presenting at 65 weeks with confirmed EDD:03/31/16. for active labor with regular contractions since 5:00 am this morning   Pregnancy followed at [facility] since 7  weeks and remarkable for:  1. GBS positive 2. Hypothyroidism followed by Dr Jeanann Lewandowsky currently on Syntroid 175 mcg 3. 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 Mild gestational thrombocytopenia 12/2015:119 and 03/12/16: 38 5. 1st baby with GBS neonatal sepsis with GBS negative in pregnancy  OB History    Gravida Para Term Preterm AB Living   3 1 1   1 1    SAB TAB Ectopic Multiple Live Births       1   1     Past Medical History:  Diagnosis Date  . Headache(784.0)    MIGRAINE 1-2 X MONTH;  OTC MED  . Hypothyroidism 2010  . Infection    UTI X 1   Past Surgical History:  Procedure Laterality Date  . NO PAST SURGERIES      Family History:   family history includes Cancer in her maternal grandfather and maternal grandmother; Early death (age of onset: 19) in her father; Hashimoto's thyroiditis in her mother; Heart disease in her father, paternal grandfather, and paternal grandmother; Hyperlipidemia in her father; Hypertension in her father; Kidney disease in her father; Schizophrenia in her maternal aunt. Social History:    reports that she has never smoked. She has never used smokeless tobacco. She reports that she does not drink alcohol or use drugs.   Prenatal labs: ABO, Rh:  AB+ Antibody:  negative Rubella: immune RPR: Nonreactive (05/30 0000)  HBsAg:   negative HIV: Non-reactive (05/30 0000)  GBS: Positive (12/15 0000)    Prenatal Transfer Tool  Maternal Diabetes: No Genetic Screening: Declined Maternal Ultrasounds/Referrals: Normal Fetal Ultrasounds or other Referrals:  None Maternal Substance Abuse:  No Significant Maternal Medications:  Meds include: Syntroid Significant Maternal Lab Results: Lab values include: Other: platelet at 118  03/12/17   Dilation: 6 Effacement (%): 90 Station: -1 Exam by:: Philis Pique, RN Blood pressure 135/88, pulse 80, temperature 98 F (36.7 C), temperature source Oral, resp. rate 20, height 5\' 2"  (1.575 m), weight 208 lb (94.3 kg), last menstrual period 06/25/2015, currently breastfeeding.  General Appearance: Alert, appropriate appearance for age. No acute distress HEENT Exam: Grossly normal Chest/Respiratory Exam: Normal chest wall and respirations. Clear to auscultation  Cardiovascular Exam: Regular rate and rhythm. S1, S2, no murmur Gastrointestinal Exam: soft, non-tender, Uterus gravid with size compatible with GA, Vertex presentation by Leopold's maneuvers Psychiatric Exam: Alert and oriented, appropriate affect  ++++++++++++++++++++++++++++++++++++++++++++++++++++++++++++++++  Vaginal exam: 8-9/100/0 AROM with clear fluid  Fetal tracings: Category 1  ++++++++++++++++++++++++++++++++++++++++++++++++++++++++++++++++   Assessment/Plan:  40 week pregnancy in active labor Imminent SVD anticipated   Delsa Bern MD 03/31/2016, 12:12 PM

## 2016-03-31 NOTE — Lactation Note (Signed)
This note was copied from a baby's chart. Lactation Consultation Note  Patient Name: Cynthia Love [id number] Date: 03/31/2016 Reason for consult: Initial assessment   Initial consult at 7 hrs; GA 40.0; BW 6 lbs, 15.8 oz.  Mom is P2 but first breastfeeding experience.   Mom has pituitary Adenoma that secretes Prolactin.  Mom stated she was being treated before pregnancy with a PO medication but has not been treated during pregnancy.  Mom has hypothyroidism on synthroid. Discussed with mom will need to follow-up with thyroid levels and follow-up after birth for adenoma.   Infant had just received bath and was about to go STS with mom. Taught hand expression with return demonstration and observation of colostrum easily expressed. Mom wanted to latch crying infant.  Infant easily latched on right breast cross-cradle. Taught mom how to sandwich breast, latch using asymmetrical latching technique, and chin tug. Infant fed with consistent sucking rhythm; swallows heard.  LS-8.   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  Mom appreciative of assistance and retained information well.  Asked appropriate questions. Educated on size of infant's stomach, cluster feeding, and continuing to feed with cues. Lactation brochure given and informed of hospital support group and outpatient services. Encouraged to call for assistance as needed.      Maternal Data Has patient been taught Hand Expression?: Yes Does the patient have breastfeeding experience prior to this delivery?: No  Feeding Feeding Type: Breast Fed  LATCH Score/Interventions Latch: Grasps breast easily, tongue down, lips flanged, rhythmical sucking.  Audible Swallowing: A few with stimulation  Type of Nipple: Everted at rest and after stimulation  Comfort (Breast/Nipple): Soft / non-tender     Hold (Positioning): Assistance needed to correctly position infant at breast and maintain latch. Intervention(s): Breastfeeding basics  reviewed;Support Pillows;Skin to skin  LATCH Score: 8  Lactation Tools Discussed/Used     Consult Status Consult Status: Follow-up Date: 04/01/16 Follow-up type: In-patient    Merlene Laughter 03/31/2016, 9:00 PM

## 2016-04-01 LAB — CBC
HEMATOCRIT: 33.6 % — AB (ref 36.0–46.0)
HEMOGLOBIN: 11.5 g/dL — AB (ref 12.0–15.0)
MCH: 28.9 pg (ref 26.0–34.0)
MCHC: 34.2 g/dL (ref 30.0–36.0)
MCV: 84.4 fL (ref 78.0–100.0)
Platelets: 148 10*3/uL — ABNORMAL LOW (ref 150–400)
RBC: 3.98 MIL/uL (ref 3.87–5.11)
RDW: 13.4 % (ref 11.5–15.5)
WBC: 13.1 10*3/uL — ABNORMAL HIGH (ref 4.0–10.5)

## 2016-04-01 LAB — RPR: RPR Ser Ql: NONREACTIVE

## 2016-04-01 NOTE — Clinical Social Work Maternal (Signed)
  CLINICAL SOCIAL WORK MATERNAL/CHILD NOTE  Patient Details  Name: Cynthia Love MRN: [id number] Date of Birth: 10/10/1985  Date:  04/01/2016  Clinical Social Worker Initiating Note:  Shae Augello, LCSW Date/ Time Initiated:  04/01/16/1030     Child's Name:  Cynthia Love   Legal Guardian:  Other (Comment) (Parents: Cynthia Love and Cynthia Love)   Need for Interpreter:      Date of Referral:  04/01/16     Reason for Referral:  Other (Comment) (Hx PPD)   Referral Source:  Central Nursery   Address:  [address] [place], [place] [address]  Phone number:  [phone]   Household Members:  Minor Children, Spouse (Couple has one other child: Ian-03/15/12)   Natural Supports (not living in the home):  Friends, Immediate Family, Extended Family (Good supports.  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  FOB works for [employer] (20 weeks Paternity leave).   Education:      Financial Resources:  Private Insurance   Other Resources:      Cultural/Religious Considerations Which May Impact Care: None stated.    Strengths:  Ability to meet basic needs , Pediatrician chosen , Home prepared for child    Risk Factors/Current Problems:  None   Cognitive State:  Alert , Able to Concentrate , Linear Thinking , Insightful , Goal Oriented    Mood/Affect:  Comfortable , Calm , Interested    CSW Assessment: CSW met with [name] in her first floor room/143 to offer support and complete assessment due to hx of PPD.  [name] was soft spoken, welcoming, and easy to engage.  She gave permission to speak openly with her mother and FOB present.   [name] reports that she and baby are doing well.  She reports feeling slightly frustrated at the slow process of learning to breast feed, but states she is acknowledging this feeling and making a conscience effort to relax and  remember that baby was just born yesterday.  CSW commends her for this and encouraged her to utilize lactation support services.  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  She states her symptoms were mild and that they went away without the need for medication or counseling.  She feels she was "over-protective," and had a hard time allowing others to care for the baby.  She states she is not overly concerned that she will experience this again, but states understanding that it is important to monitor emotions and report any mental health concerns.  CSW provided support group resources as well as "New Mom Checklist," as a self-evaluation tool.  [name] was appreciative and states no questions, concerns or needs at this time.  CSW Plan/Description:  Information/Referral to Community Resources , No Further Intervention Required/No Barriers to Discharge, Patient/Family Education     Jacksen Isip Elizabeth, LCSW 04/01/2016, 1:20 PM 

## 2016-04-01 NOTE — Progress Notes (Signed)
Subjective: Postpartum Day 1: Vaginal delivery, none laceration Patient up ad lib, reports no syncope or dizziness. Feeding:  Breast Contraceptive plan:  undecided  Objective: Vital signs in last 24 hours: Temp:  [97.4 F (36.3 C)-98.3 F (36.8 C)] 98.3 F (36.8 C) (01/10 0417) Pulse Rate:  [66-94] 83 (01/10 0417) Resp:  [14-20] 14 (01/10 0417) BP: (126-152)/(69-88) 140/69 (01/10 0417) SpO2:  [100 %] 100 % (01/10 0417) Weight:  [94.3 kg (208 lb)] 94.3 kg (208 lb) (01/09 1016)  Physical Exam:  General: alert, cooperative and no distress Lochia: appropriate Uterine Fundus: firm Perineum: healing well,Intact DVT Evaluation: No evidence of DVT seen on physical exam. Negative Homan's sign.   CBC Latest Ref Rng & Units 04/01/2016 03/31/2016 03/16/2012  WBC 4.0 - 10.5 K/uL 13.1(H) 12.4(H) 14.6(H)  Hemoglobin 12.0 - 15.0 g/dL 11.5(L) 12.3 11.6(L)  Hematocrit 36.0 - 46.0 % 33.6(L) 35.7(L) 34.6(L)  Platelets 150 - 400 K/uL 148(L) 130(L) 130(L)     Assessment/Plan: Status post vaginal delivery day 1. Stable Continue current care. Plan for discharge tomorrow and Breastfeeding  Inpt circ today    Pleas Koch ProtheroCNM 04/01/2016, 6:52 AM

## 2016-04-01 NOTE — Lactation Note (Signed)
This note was copied from a baby's chart. Lactation Consultation Note: Mother paged for lactation; mother states that infant has been cluster feeding and she is very tired. She complaints of slight soreness on (R) nipple. Observed small red scab. Mother was given coconut oil by staff nurse. Comfort gels given and advised mother to use one or the other. Mother has hand pump at bedside to use to firm nipple. Mother advised to nap while infant is napping. Mother to page to have latched checked with next feeding.   Patient Name: Cynthia Love [id number] Date: 04/01/2016 Reason for consult: Follow-up assessment   Maternal Data    Feeding Feeding Type: Breast Fed Length of feed: 30 min  LATCH Score/Interventions                      Lactation Tools Discussed/Used     Consult Status Consult Status: Follow-up Date: 04/02/16 Follow-up type: In-patient    Jess Barters [facility] 04/01/2016, 2:05 PM

## 2016-04-02 MED ORDER — FERROUS SULFATE 325 (65 FE) MG PO TABS: 325.0000 mg | ORAL_TABLET | Freq: Two times a day (BID) | ORAL | 3 refills | Status: DC

## 2016-04-02 MED ORDER — IBUPROFEN 600 MG PO TABS: 600.0000 mg | ORAL_TABLET | Freq: Four times a day (QID) | ORAL | 0 refills | Status: DC

## 2016-04-02 MED FILL — IBUPROFEN 600 MG TABLET: 600 | 7 days supply | Qty: 30 | Fill #0

## 2016-04-02 NOTE — Lactation Note (Signed)
This note was copied from a baby's chart. Lactation Consultation Note: Observed mother latching infant in cross cradle hold. Infant latched on and off for a few sucks. Mother states that he just finished feeding on alternate breast. Mother states she sees infant swallow and lower jaw moving. Mother denies having nipple pain today. She states latch feels better. Mother advised to do good breast massage . Mother is aware of all [place] services.  Patient Name: Boy Daliah Aikin [id number] Date: 04/02/2016     Maternal Data    Feeding Feeding Type: Breast Fed Length of feed: 30 min  LATCH Score/Interventions Latch: Grasps breast easily, tongue down, lips flanged, rhythmical sucking.  Audible Swallowing: A few with stimulation Intervention(s): Hand expression  Type of Nipple: Everted at rest and after stimulation  Comfort (Breast/Nipple): Filling, red/small blisters or bruises, mild/mod discomfort  Problem noted: Mild/Moderate discomfort Interventions (Mild/moderate discomfort):  (coconut oil)  Hold (Positioning): No assistance needed to correctly position infant at breast.  LATCH Score: 8  Lactation Tools Discussed/Used     Consult Status      Darla Lesches 04/02/2016, 12:44 PM

## 2016-04-02 NOTE — Discharge Summary (Signed)
OB Discharge Summary     Patient Name: Cynthia Love DOB: 09/27/1985 MRN: [id number]  Date of admission: 03/31/2016 Delivering MD: Delsa Bern   Date of discharge: 04/02/2016  Admitting diagnosis: 40WKS, CT Intrauterine pregnancy: [redacted]w[redacted]d     Secondary diagnosis:  Active Problems:   Hypothyroidism complicating pregnancy   Vaginal delivery   Labor and delivery indication for care or intervention  Additional problems: None     Discharge diagnosis: Term Pregnancy Delivered                                                                                                Post partum procedures:None  Augmentation: None  Complications: None  Hospital course:  Onset of Labor With Vaginal Delivery     31 y.o. yo [id number] at [redacted]w[redacted]d was admitted in Active Labor on 03/31/2016. Patient had an uncomplicated labor course as follows:  Membrane Rupture Time/Date: 12:45 PM ,03/31/2016   Intrapartum Procedures: Episiotomy: None [1]                                         Lacerations:  None [1]  Patient had a delivery of a Viable infant. 03/31/2016  Information for the patient's newborn:  Azadeh, Kalicki [id number]  Delivery Method: Vaginal, Spontaneous Delivery (Filed from Delivery Summary)    Pateint had an uncomplicated postpartum course.  She is ambulating, tolerating a regular diet, passing flatus, and urinating well. Patient is discharged home in stable condition on 04/02/16.    Physical exam Vitals:   03/31/16 2017 04/01/16 0417 04/01/16 0858 04/01/16 1825  BP: (!) 152/80 140/69 122/78 126/77  Pulse: 81 83 78 80  Resp: 16 14 18 17   Temp: 98.3 F (36.8 C) 98.3 F (36.8 C)  98 F (36.7 C)  TempSrc: Oral Oral  Oral  SpO2: 100% 100%    Weight:      Height:       General: alert, cooperative and no distress Lochia: appropriate Uterine Fundus: firm Incision: N/A DVT Evaluation: No significant calf/ankle edema. Labs: Lab Results  Component Value Date   WBC 13.1 (H) 04/01/2016   HGB 11.5 (L) 04/01/2016   HCT 33.6 (L) 04/01/2016   MCV 84.4 04/01/2016   PLT 148 (L) 04/01/2016   CMP Latest Ref Rng & Units 12/25/2011  Glucose 70 - 104 mg/dL 79    Discharge instruction: per After Visit Summary and "Baby and Me Booklet". Pain Management, Peri-Care, Breastfeeding, Who and When to call for postpartum complications. Information Sheet(s) given None   After visit meds:  Allergies as of 04/02/2016      Reactions   Nickel Rash   Rash      Medication List    TAKE these medications   ferrous sulfate 325 (65 FE) MG tablet Take 1 tablet (325 mg total) by mouth 2 (two) times daily with a meal. For 14 days, then once daily for 28 days.   ibuprofen 600 MG tablet Commonly known as:  ADVIL,MOTRIN Take 1  tablet (600 mg total) by mouth every 6 (six) hours. What changed:  when to take this  reasons to take this   levothyroxine 175 MCG tablet Commonly known as:  SYNTHROID, LEVOTHROID Take 175 mcg by mouth daily before breakfast.   prenatal multivitamin Tabs tablet Take 1 tablet by mouth daily.       Diet: routine diet  Activity: Advance as tolerated. Pelvic rest for 6 weeks.   Outpatient follow up:6 weeks Follow up Appt:No future appointments. Follow up Visit:No Follow-up on file.  Postpartum contraception: Condoms  Newborn Data: Live born female "Marylyn Ishihara" Birth Weight: 6 lb 15.8 oz (3170 g) APGAR: 9, 9  Baby Feeding: Breast Disposition:home with mother   04/02/2016 Maryann Conners, CNM

## 2016-04-07 DIAGNOSIS — E039 Hypothyroidism, unspecified: Secondary | ICD-10-CM | POA: Diagnosis not present

## 2016-04-07 DIAGNOSIS — D353 Benign neoplasm of craniopharyngeal duct: Secondary | ICD-10-CM | POA: Diagnosis not present

## 2016-04-21 DIAGNOSIS — N644 Mastodynia: Secondary | ICD-10-CM | POA: Diagnosis not present

## 2016-04-21 DIAGNOSIS — N61 Mastitis without abscess: Secondary | ICD-10-CM | POA: Diagnosis not present

## 2016-04-21 MED FILL — DICLOXACILLIN 500 MG CAP: 500 | 7 days supply | Qty: 28 | Fill #0

## 2016-05-08 DIAGNOSIS — Z3040 Encounter for surveillance of contraceptives, unspecified: Secondary | ICD-10-CM | POA: Diagnosis not present

## 2016-05-08 DIAGNOSIS — O99345 Other mental disorders complicating the puerperium: Secondary | ICD-10-CM | POA: Diagnosis not present

## 2016-05-13 MED FILL — SYNTHROID 175 MCG TABLET: 175 | 60 days supply | Qty: 60 | Fill #3

## 2016-07-16 DIAGNOSIS — E039 Hypothyroidism, unspecified: Secondary | ICD-10-CM | POA: Diagnosis not present

## 2016-07-16 DIAGNOSIS — D353 Benign neoplasm of craniopharyngeal duct: Secondary | ICD-10-CM | POA: Diagnosis not present

## 2016-07-20 MED FILL — SYNTHROID 175 MCG TABLET: 175 | 90 days supply | Qty: 90 | Fill #0

## 2016-10-12 MED FILL — SYNTHROID 175 MCG TABLET: 175 | 90 days supply | Qty: 90 | Fill #1

## 2016-10-21 ENCOUNTER — Other Ambulatory Visit (HOSPITAL_COMMUNITY)
Admission: RE | Admit: 2016-10-21 | Discharge: 2016-10-21 | Disposition: A | Discharge status: Home / Self Care | Payer: 59 | Source: Ambulatory Visit | Attending: Internal Medicine | Admitting: Internal Medicine

## 2016-10-21 DIAGNOSIS — D352 Benign neoplasm of pituitary gland: Secondary | ICD-10-CM | POA: Insufficient documentation

## 2016-10-21 DIAGNOSIS — E221 Hyperprolactinemia: Secondary | ICD-10-CM | POA: Insufficient documentation

## 2016-10-21 LAB — TSH

## 2016-10-22 LAB — T3: T3, Total: 122 ng/dL (ref 71–180)

## 2016-10-22 LAB — PROLACTIN: Prolactin: 50.7 ng/mL — ABNORMAL HIGH (ref 4.8–23.3)

## 2016-11-05 DIAGNOSIS — D353 Benign neoplasm of craniopharyngeal duct: Secondary | ICD-10-CM | POA: Diagnosis not present

## 2016-11-05 DIAGNOSIS — E039 Hypothyroidism, unspecified: Secondary | ICD-10-CM | POA: Diagnosis not present

## 2016-11-17 MED FILL — CABERGOLINE 0.5 MG TABLET: 0.5 | 28 days supply | Qty: 12 | Fill #0

## 2016-11-29 NOTE — Progress Notes (Signed)
HPI:  Cynthia Love is here to establish care. Seeing gyn and endocrinologist. Denies any depression - hx postpartum depression. Reports some exercise and feels diet is ok - working on trying to cook more instead of fast food with a busy schedule. Due for flu, Tdap - reports done, Pap - reports done in 2016 with ob/gyn - [place]  Has the following chronic problems that require follow up and concerns today:  Hypothyroidism: -managed by Dr. Jeanann Lewandowsky, endocrine -on levothyroxine - reports recently specialist considering lower dose  Pituitary adenoma: -managed by Dr. Jeanann Lewandowsky, endocrine -meds:recently restarted cabergoline  Obesity/Hx mild HLD: -never on meds for cholesterol - reports ratio was good and chol improved with lifestyle in the past  ROS negative for unless reported above: fevers, unintentional weight loss, hearing or vision loss, chest pain, palpitations, struggling to breath, hemoptysis, melena, hematochezia, hematuria, falls, loc, si, thoughts of self harm  Past Medical History:  Diagnosis Date  . Headache(784.0)    MIGRAINE 1-2 X MONTH;  OTC MED  . Hypothyroidism 2010  . Infection    UTI X 1  . Postpartum depression 04/19/2012    Past Surgical History:  Procedure Laterality Date  . NO PAST SURGERIES      Family History  Problem Relation Age of Onset  . Hashimoto's thyroiditis Mother   . Heart disease Father        MYCOCARDIAL INFARCTION  . Early death Father 76  . Hyperlipidemia Father   . Hypertension Father   . Kidney disease Father        DIALYSIS  . Schizophrenia Maternal Aunt   . Cancer Maternal Grandmother        PANCREATIC  . Cancer Maternal Grandfather        NON HODGKINS LYNMPHOMA  . Heart disease Paternal Grandmother   . Heart disease Paternal Grandfather     Social History   Social History  . Marital status: Married    Spouse name: LEI Antony  . Number of children: N/A  . Years of education: 2   Occupational  History  . RN    Social History Main Topics  . Smoking status: Never Smoker  . Smokeless tobacco: Never Used  . Alcohol use No     Comment: 1-2 X/MONTH  . Drug use: No  . Sexual activity: Not Currently    Partners: Male    Birth control/ protection: None, Condom   Other Topics Concern  . None   Social History Narrative  . None     Current Outpatient Prescriptions:  .  cabergoline (DOSTINEX) 0.5 MG tablet, take 1 tablet three times a week, Disp: , Rfl:  .  levothyroxine (SYNTHROID, LEVOTHROID) 175 MCG tablet, Take 175 mcg by mouth daily before breakfast., Disp: , Rfl:  .  Prenatal Vit-Fe Fumarate-FA (PRENATAL MULTIVITAMIN) TABS, Take 1 tablet by mouth daily., Disp: , Rfl:   EXAM:  Vitals:   11/30/16 1255  BP: 118/72  Pulse: 75  Temp: 98.3 F (36.8 C)    Body mass index is 32.13 kg/m.  GENERAL: vitals reviewed and listed above, alert, oriented, appears well hydrated and in no acute distress  HEENT: atraumatic, conjunttiva clear, no obvious abnormalities on inspection of external nose and ears  NECK: no obvious masses on inspection  LUNGS: clear to auscultation bilaterally, no wheezes, rales or rhonchi, good air movement  CV: HRRR, no peripheral edema  MS: moves all extremities without noticeable abnormality  PSYCH: pleasant and cooperative, no  obvious depression or anxiety  ASSESSMENT AND PLAN:  Discussed the following assessment and plan:  BMI 32.0-32.9,adult - Plan: Hemoglobin A1c, Lipid panel  Hypothyroidism, unspecified type - -sees endocrinollgist for management, Dr. Jeanann Lewandowsky  Pituitary adenoma [facility]) - Sees endocrinologist for management, Dr. Jeanann Lewandowsky  Encounter to establish care with new doctor -We reviewed the PMH, PSH, FH, SH, Meds and Allergies. -fasting labs for baseline -healthy lifestyle advised -cont care with Dr. Carlis Abbott for hypothyroidism and pituitary adenoma -flu shot today -follow up yearly for CPE   -Patient advised to  return or notify a doctor immediately if symptoms worsen or persist or new concerns arise.  Patient Instructions  BEFORE YOU LEAVE: -flu shot -lab appointment for fasting labs in next 1-2 months -follow up: yearly  We have ordered labs or studies at this visit. It can take up to 1-2 weeks for results and processing. IF results require follow up or explanation, we will call you with instructions. Clinically stable results will be released to your [facility]. If you have not heard from [place] or cannot find your results in [facility] in 2 weeks please contact our office at [phone].  If you are not yet signed up for [facility], please consider signing up.   We recommend the following healthy lifestyle for LIFE: 1) Small portions.   Tip: eat off of a salad plate instead of a dinner plate.  Tip: It is ok to feel hungry after a meal - that likely means you ate an appropriate portion.  Tip: if you need more or a snack choose fruits, veggies and/or a handful of nuts or seeds.  2) Eat a healthy clean diet.   TRY TO EAT: -at least 5-7 servings of low sugar vegetables per day (not corn, potatoes or bananas.) -berries are the best choice if you wish to eat fruit.   -lean meets (fish, chicken or [place] breasts) -vegan proteins for some meals - beans or tofu, whole grains, nuts and seeds -Replace bad fats with good fats - good fats include: fish, nuts and seeds, canola oil, olive oil -small amounts of low fat or non fat dairy -small amounts of100 % whole grains - check the lables  AVOID: -SUGAR, sweets, anything with added sugar, corn syrup or sweeteners -if you must have a sweetener, small amounts of stevia may be best -sweetened beverages -simple starches (rice, bread, potatoes, pasta, chips, etc - small amounts of 100% whole grains are ok) -red meat, pork, butter -fried foods, fast food, processed food, excessive dairy, eggs and coconut.  3)Get at least 150 minutes of sweaty aerobic exercise per  week.  4)Reduce stress - consider counseling, meditation and relaxation to balance other aspects of your life.   WE NOW OFFER   [facility] FAST TRACK!!!  SAME DAY Appointments for ACUTE CARE  Such as: Sprains, Injuries, cuts, abrasions, rashes, muscle pain, joint pain, back pain Colds, flu, sore throats, headache, allergies, cough, fever  Ear pain, sinus and eye infections Abdominal pain, nausea, vomiting, diarrhea, upset stomach Animal/insect bites  3 Easy Ways to Schedule: Walk-In Scheduling Call in scheduling Mychart Sign-up: [email or web address]                  Colin Benton R.

## 2016-11-30 ENCOUNTER — Encounter: Payer: Self-pay | Admitting: Family Medicine

## 2016-11-30 ENCOUNTER — Ambulatory Visit (INDEPENDENT_AMBULATORY_CARE_PROVIDER_SITE_OTHER): Payer: 59 | Admitting: Family Medicine

## 2016-11-30 VITALS — BP 118/72 | HR 75 | Temp 98.3°F | Ht 63.0 in | Wt 181.4 lb

## 2016-11-30 DIAGNOSIS — Z7689 Persons encountering health services in other specified circumstances: Secondary | ICD-10-CM

## 2016-11-30 DIAGNOSIS — E039 Hypothyroidism, unspecified: Secondary | ICD-10-CM | POA: Diagnosis not present

## 2016-11-30 DIAGNOSIS — Z23 Encounter for immunization: Secondary | ICD-10-CM

## 2016-11-30 DIAGNOSIS — Z6832 Body mass index (BMI) 32.0-32.9, adult: Secondary | ICD-10-CM | POA: Diagnosis not present

## 2016-11-30 DIAGNOSIS — D352 Benign neoplasm of pituitary gland: Secondary | ICD-10-CM | POA: Insufficient documentation

## 2016-11-30 NOTE — Addendum Note (Signed)
Addended by: Agnes Lawrence on: 11/30/2016 01:52 PM   Modules accepted: Orders

## 2016-11-30 NOTE — Patient Instructions (Signed)
BEFORE YOU LEAVE: -flu shot -lab appointment for fasting labs in next 1-2 months -follow up: yearly  We have ordered labs or studies at this visit. It can take up to 1-2 weeks for results and processing. IF results require follow up or explanation, we will call you with instructions. Clinically stable results will be released to your [facility]. If you have not heard from [place] or cannot find your results in [facility] in 2 weeks please contact our office at [phone].  If you are not yet signed up for [facility], please consider signing up.   We recommend the following healthy lifestyle for LIFE: 1) Small portions.   Tip: eat off of a salad plate instead of a dinner plate.  Tip: It is ok to feel hungry after a meal - that likely means you ate an appropriate portion.  Tip: if you need more or a snack choose fruits, veggies and/or a handful of nuts or seeds.  2) Eat a healthy clean diet.   TRY TO EAT: -at least 5-7 servings of low sugar vegetables per day (not corn, potatoes or bananas.) -berries are the best choice if you wish to eat fruit.   -lean meets (fish, chicken or [place] breasts) -vegan proteins for some meals - beans or tofu, whole grains, nuts and seeds -Replace bad fats with good fats - good fats include: fish, nuts and seeds, canola oil, olive oil -small amounts of low fat or non fat dairy -small amounts of100 % whole grains - check the lables  AVOID: -SUGAR, sweets, anything with added sugar, corn syrup or sweeteners -if you must have a sweetener, small amounts of stevia may be best -sweetened beverages -simple starches (rice, bread, potatoes, pasta, chips, etc - small amounts of 100% whole grains are ok) -red meat, pork, butter -fried foods, fast food, processed food, excessive dairy, eggs and coconut.  3)Get at least 150 minutes of sweaty aerobic exercise per week.  4)Reduce stress - consider counseling, meditation and relaxation to balance other aspects of your  life.   WE NOW OFFER   [facility] FAST TRACK!!!  SAME DAY Appointments for ACUTE CARE  Such as: Sprains, Injuries, cuts, abrasions, rashes, muscle pain, joint pain, back pain Colds, flu, sore throats, headache, allergies, cough, fever  Ear pain, sinus and eye infections Abdominal pain, nausea, vomiting, diarrhea, upset stomach Animal/insect bites  3 Easy Ways to Schedule: Walk-In Scheduling Call in scheduling Mychart Sign-up: [email or web address]

## 2016-12-10 ENCOUNTER — Encounter: Payer: Self-pay | Admitting: Family Medicine

## 2016-12-10 ENCOUNTER — Other Ambulatory Visit (INDEPENDENT_AMBULATORY_CARE_PROVIDER_SITE_OTHER): Payer: 59

## 2016-12-10 DIAGNOSIS — Z6832 Body mass index (BMI) 32.0-32.9, adult: Secondary | ICD-10-CM

## 2016-12-10 LAB — LIPID PANEL
CHOLESTEROL: 180 mg/dL (ref 0–200)
HDL: 71.8 mg/dL (ref >39.00)
LDL Cholesterol: 97 mg/dL (ref 0–99)
NonHDL: 107.99
Total CHOL/HDL Ratio: 3
Triglycerides: 54 mg/dL (ref 0.0–149.0)
VLDL: 10.8 mg/dL (ref 0.0–40.0)

## 2016-12-10 LAB — HEMOGLOBIN A1C: HEMOGLOBIN A1C: 5.4 % (ref 4.6–6.5)

## 2016-12-18 MED FILL — CABERGOLINE 0.5 MG TABLET: 0.5 | 28 days supply | Qty: 12 | Fill #1

## 2017-01-06 MED FILL — SYNTHROID 175 MCG TABLET: 175 | 90 days supply | Qty: 90 | Fill #2

## 2017-01-15 MED FILL — CABERGOLINE 0.5 MG TABLET: 0.5 | 28 days supply | Qty: 12 | Fill #2

## 2017-02-01 DIAGNOSIS — E039 Hypothyroidism, unspecified: Secondary | ICD-10-CM | POA: Diagnosis not present

## 2017-02-01 DIAGNOSIS — D353 Benign neoplasm of craniopharyngeal duct: Secondary | ICD-10-CM | POA: Diagnosis not present

## 2017-02-17 MED FILL — CABERGOLINE 0.5 MG TABLET: 0.5 | 28 days supply | Qty: 12 | Fill #3

## 2017-04-01 ENCOUNTER — Encounter: Payer: Self-pay | Admitting: Family Medicine

## 2017-04-08 MED FILL — CABERGOLINE 0.5 MG TABLET: 0.5 | 28 days supply | Qty: 12 | Fill #4

## 2017-04-08 MED FILL — SYNTHROID 175 MCG TABLET: 175 | 90 days supply | Qty: 90 | Fill #3

## 2017-04-27 DIAGNOSIS — D443 Neoplasm of uncertain behavior of pituitary gland: Secondary | ICD-10-CM | POA: Diagnosis not present

## 2017-04-27 DIAGNOSIS — E221 Hyperprolactinemia: Secondary | ICD-10-CM | POA: Diagnosis not present

## 2017-04-27 DIAGNOSIS — E039 Hypothyroidism, unspecified: Secondary | ICD-10-CM | POA: Diagnosis not present

## 2017-04-27 DIAGNOSIS — E063 Autoimmune thyroiditis: Secondary | ICD-10-CM | POA: Diagnosis not present

## 2017-04-28 MED FILL — SYNTHROID 150 MCG TABLET: 150 | 90 days supply | Qty: 90 | Fill #0

## 2017-05-26 MED FILL — CABERGOLINE 0.5 MG TABS: 0.5 | 28 days supply | Qty: 12 | Fill #5

## 2017-06-24 DIAGNOSIS — E039 Hypothyroidism, unspecified: Secondary | ICD-10-CM | POA: Diagnosis not present

## 2017-06-28 MED FILL — SYNTHROID 137 MCG TABLET: 137 | 30 days supply | Qty: 30 | Fill #0

## 2017-06-28 MED FILL — CABERGOLINE 0.5 MG TABS: 0.5 | 28 days supply | Qty: 8 | Fill #0

## 2017-07-20 IMAGING — DX DG FOOT COMPLETE 3+V*R*
3 series · 3 of 3 positions shown · non-contrast
Comparison: None.

CLINICAL DATA: Fell down steps this morning. Injured right ankle
and foot.

EXAM:
RIGHT FOOT COMPLETE - 3+ VIEW; RIGHT ANKLE - COMPLETE 3+ VIEW

[foot ap]
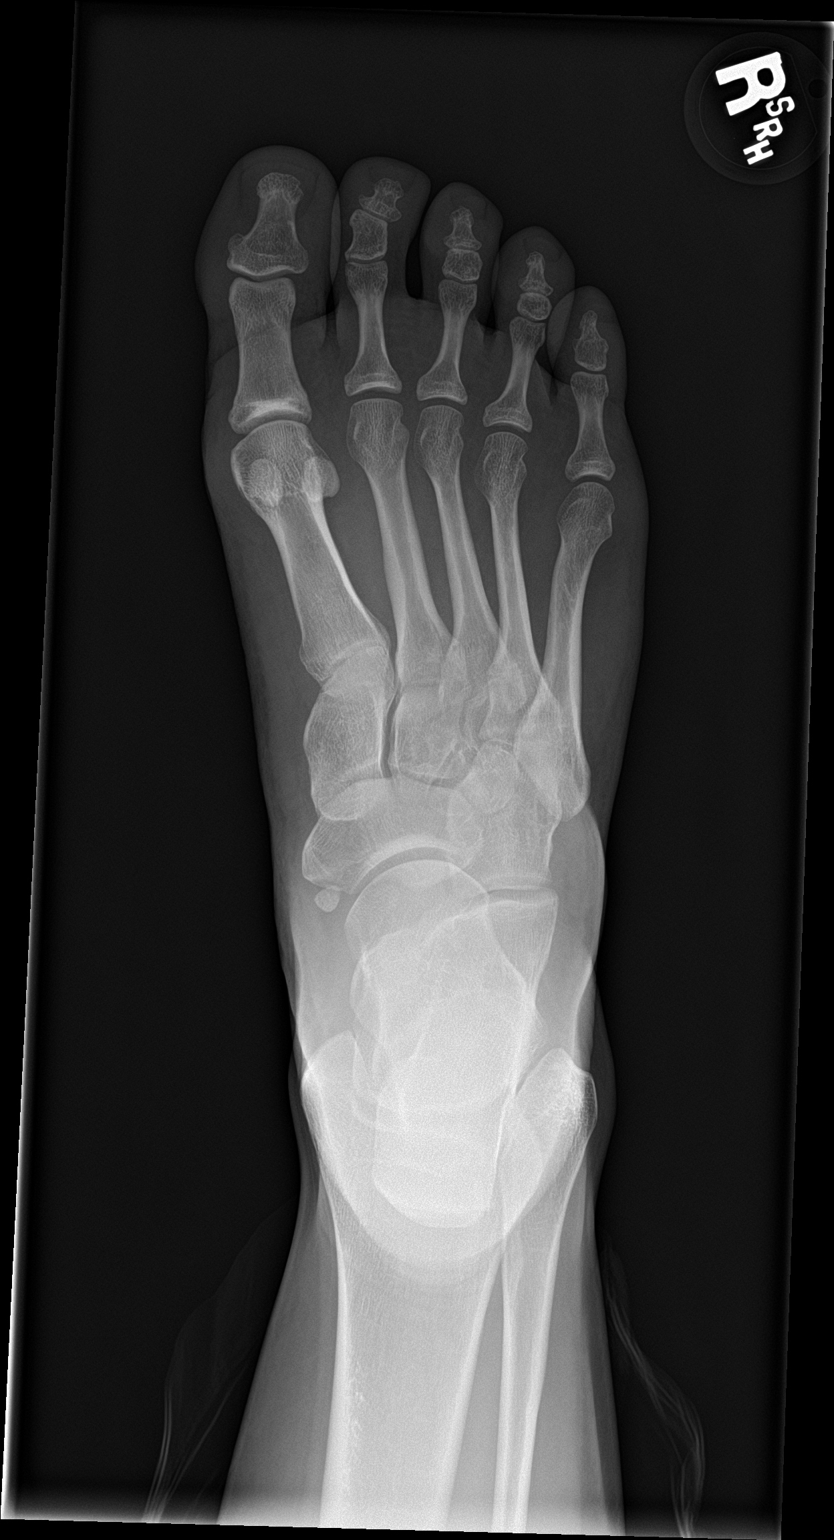

[foot obl]
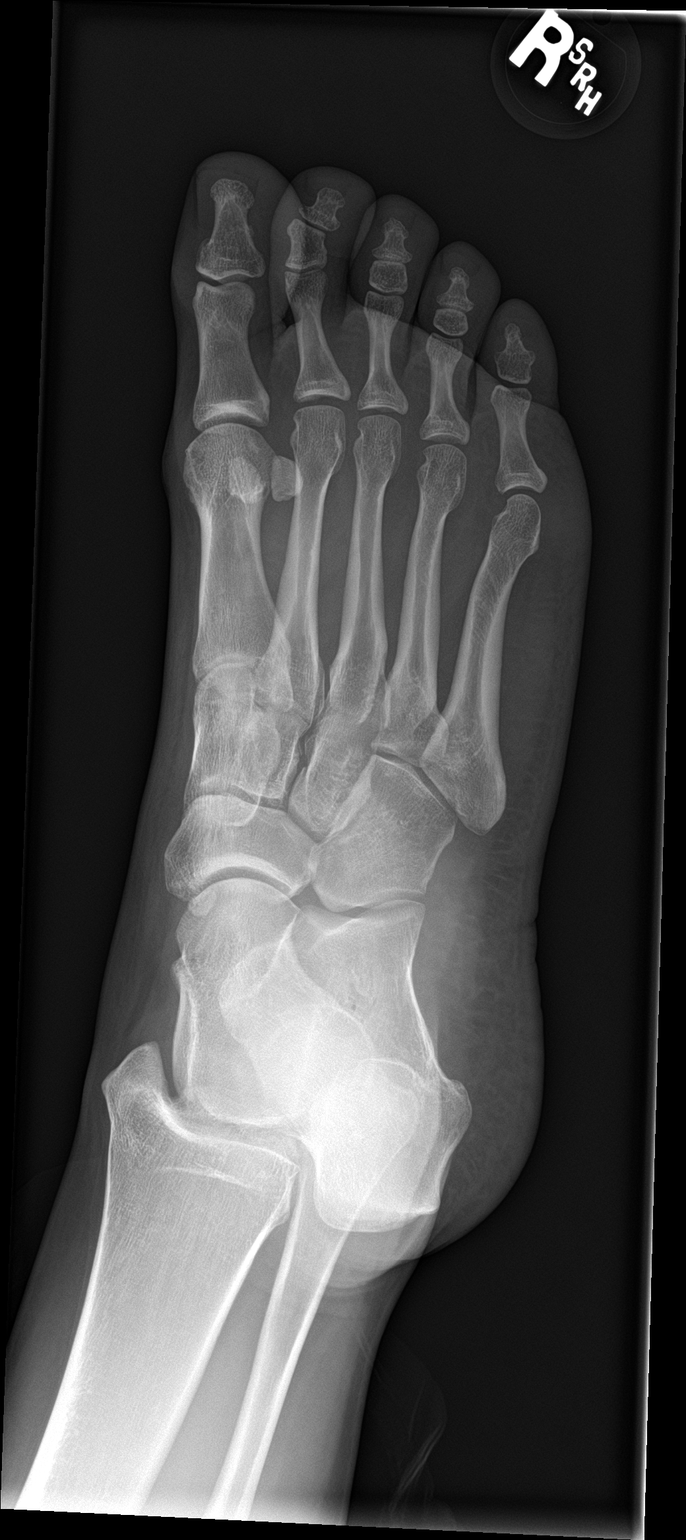

[foot lat]
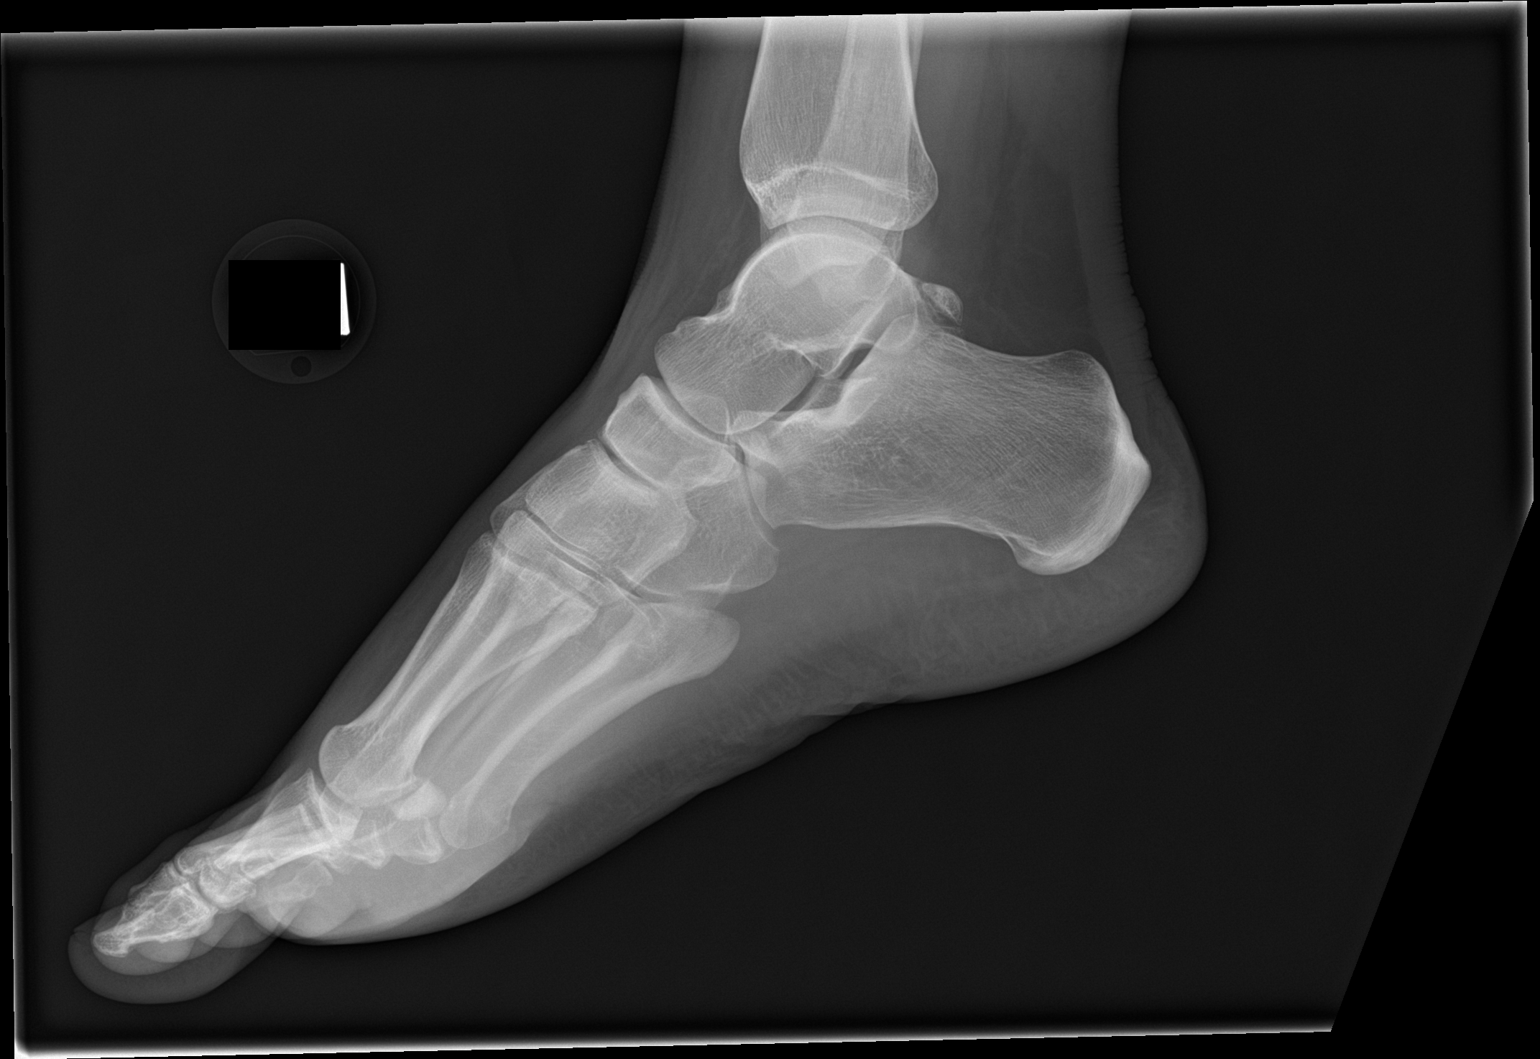

[3 of 3 positions shown; findings below may reference images not displayed]

FINDINGS: Right ankle:

The ankle mortise is normal. No acute ankle fracture or
osteochondral abnormality. No ankle joint effusion. Os trigonum
noted.

Right foot:

The joint spaces are maintained. Possible subtle fractures of the
middle and lateral cuneiforms on the oblique film. CT may be helpful
for further evaluation.
IMPRESSION: No acute ankle fracture.

Possible subtle cuneiform fractures. Recommend CT for further
evaluation.

## 2017-07-20 IMAGING — DX DG ANKLE COMPLETE 3+V*R*
3 series · 3 of 3 positions shown · non-contrast
Comparison: None.

CLINICAL DATA: Fell down steps this morning. Injured right ankle
and foot.

EXAM:
RIGHT FOOT COMPLETE - 3+ VIEW; RIGHT ANKLE - COMPLETE 3+ VIEW

[ankle ap]
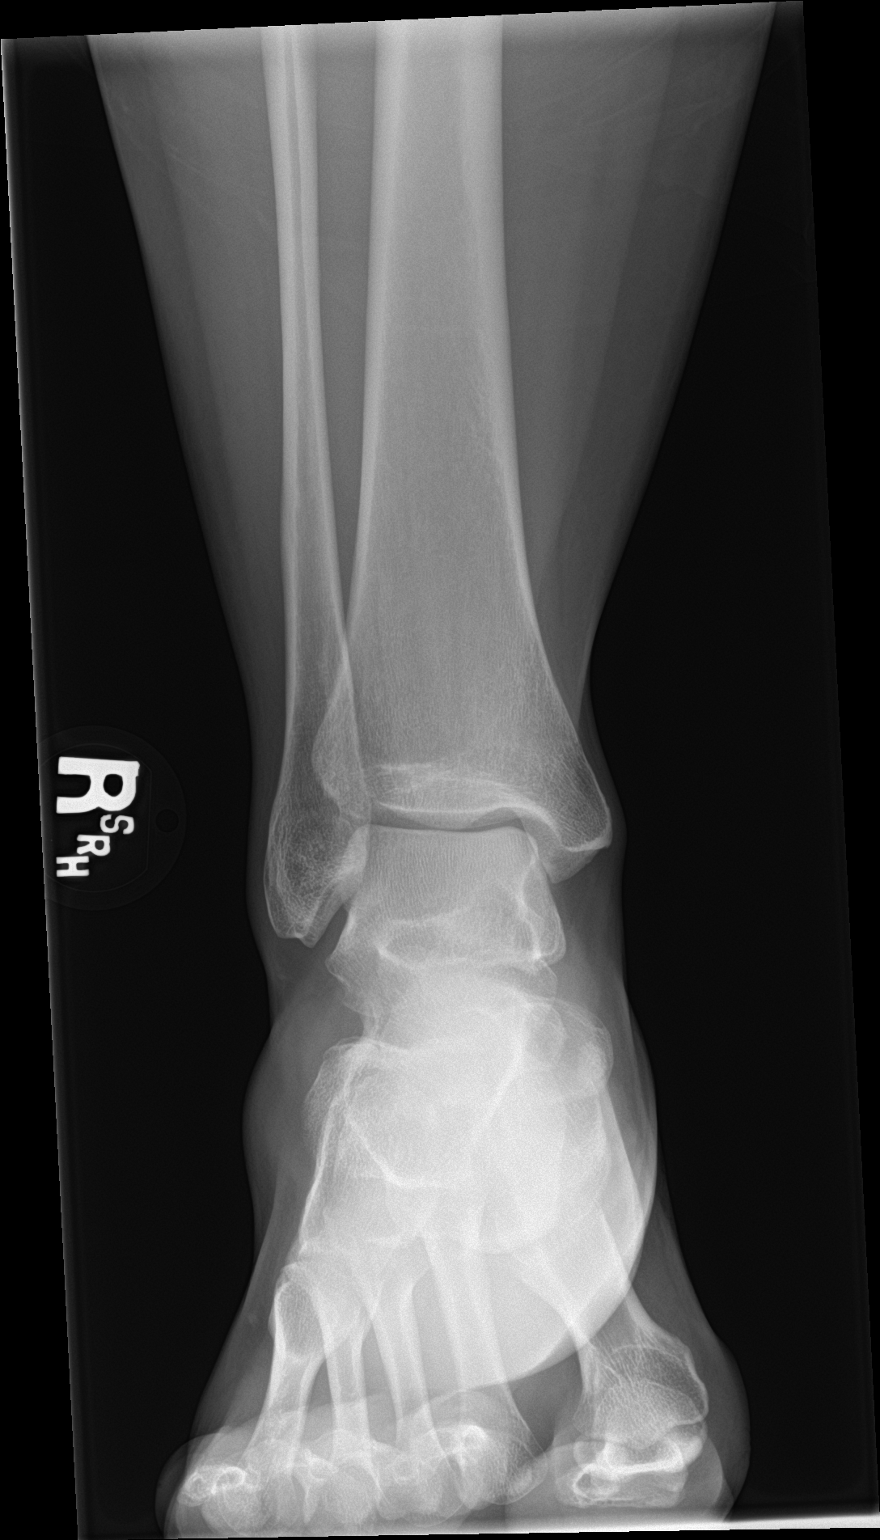

[ankle obl]
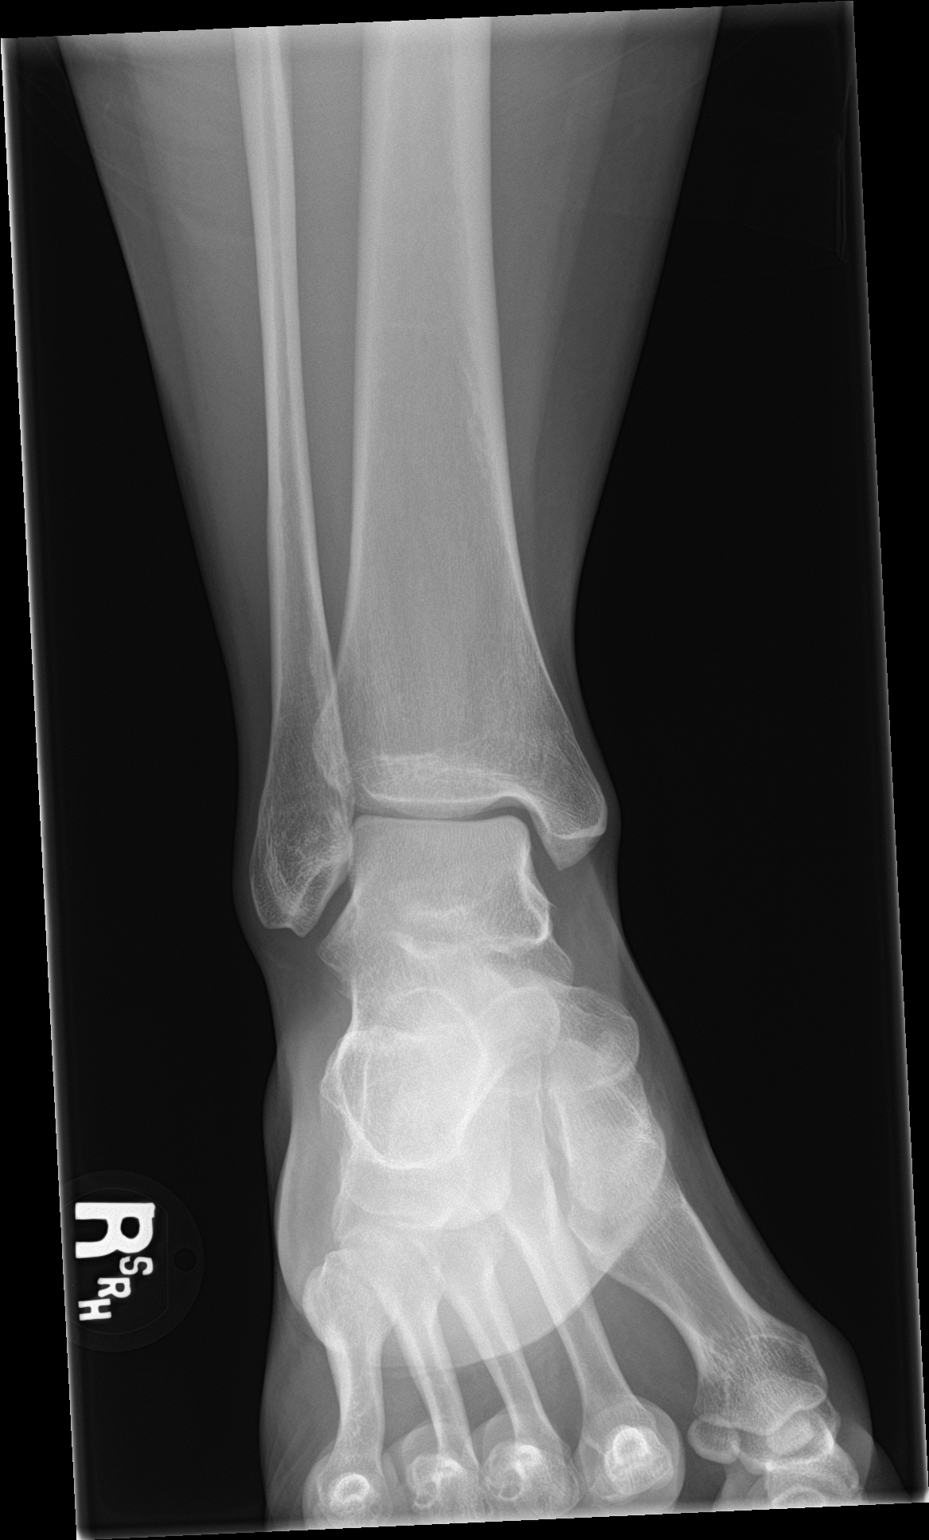

[ankle lat]
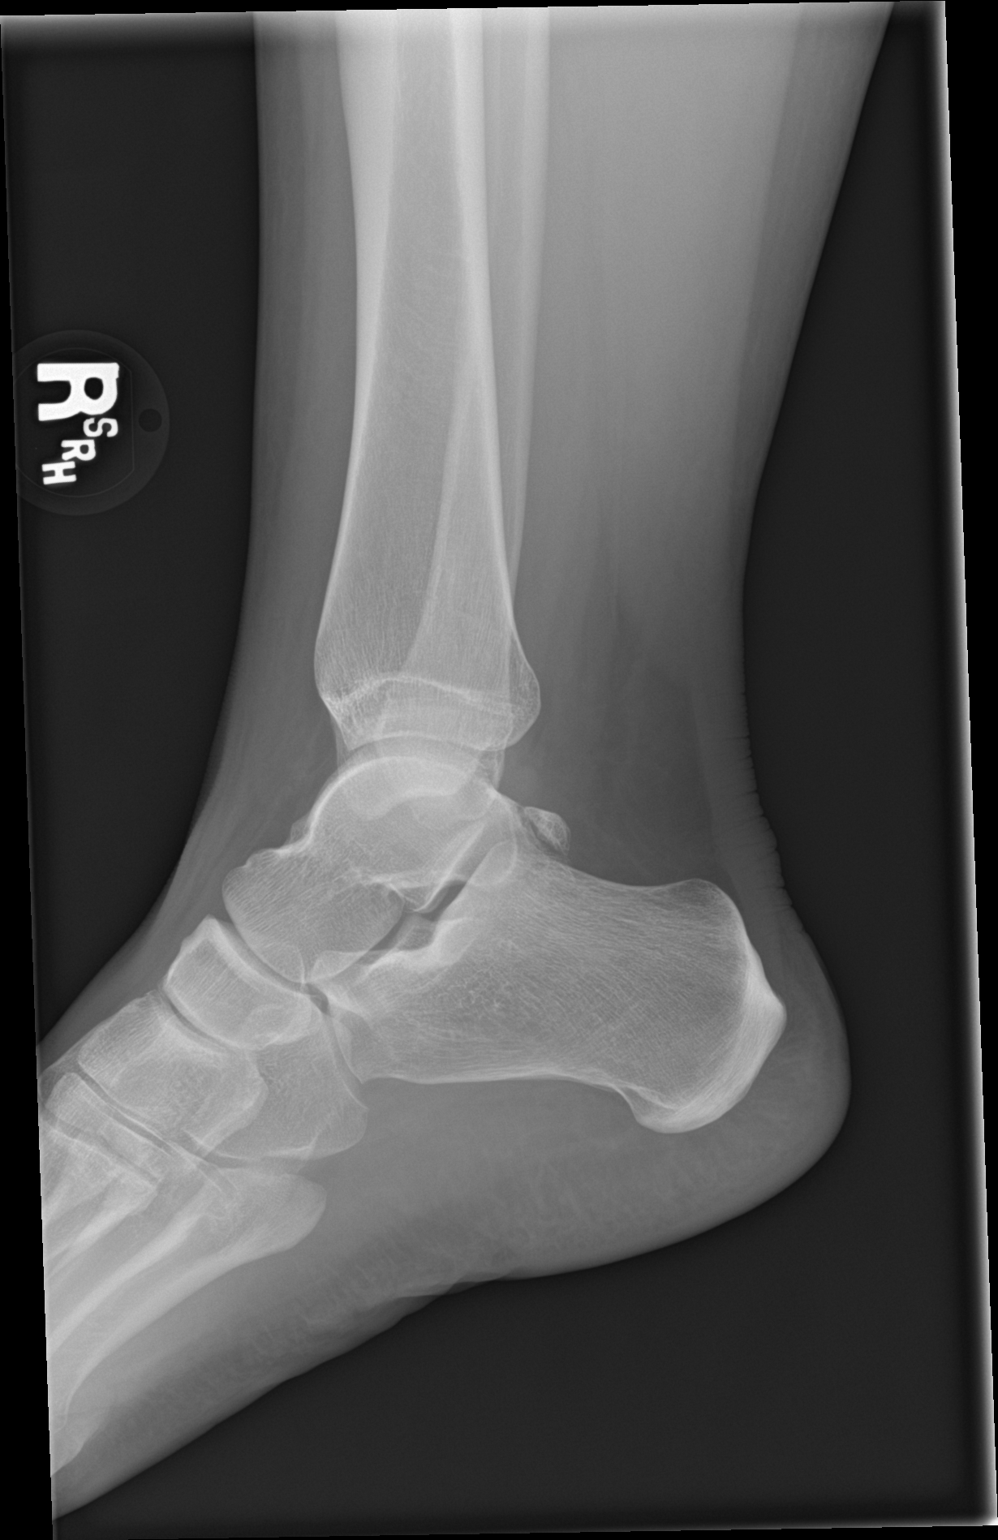

[3 of 3 positions shown; findings below may reference images not displayed]

FINDINGS: Right ankle:

The ankle mortise is normal. No acute ankle fracture or
osteochondral abnormality. No ankle joint effusion. Os trigonum
noted.

Right foot:

The joint spaces are maintained. Possible subtle fractures of the
middle and lateral cuneiforms on the oblique film. CT may be helpful
for further evaluation.
IMPRESSION: No acute ankle fracture.

Possible subtle cuneiform fractures. Recommend CT for further
evaluation.

## 2017-07-20 IMAGING — CT CT FOOT*R* W/O CM
3 of 6 series · 12 of 33 positions shown, 13 images · non-contrast
Comparison: Radiograph same day.

CLINICAL DATA: Right foot and ankle pain and swelling after
falling. Possible cuneiform fractures on radiographs. Initial
encounter.

EXAM:
CT OF THE RIGHT ANKLE WITHOUT CONTRAST
CT OF THE RIGHT FOOT WITHOUT CONTRAST
TECHNIQUE: Multidetector CT imaging of the right ankle was performed according
to the standard protocol. Multiplanar CT image reconstructions were
also generated.

[Series 10: cor st · coronal · 0.24mm/px · 3 of 147 slices shown]
[im 30/147  bone]
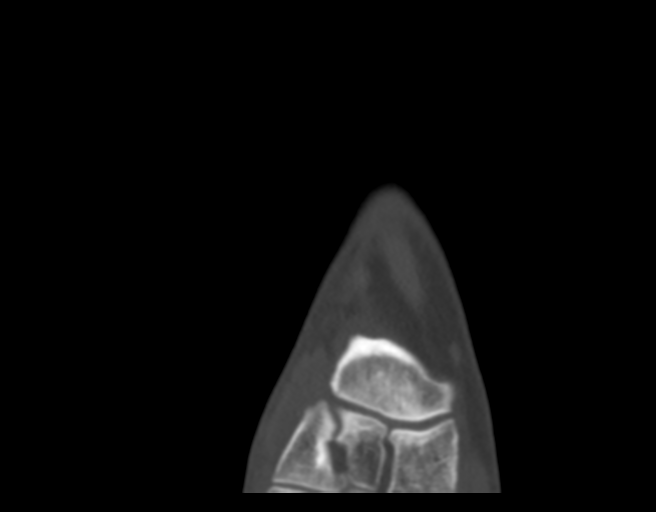
[im 59/147  bone]
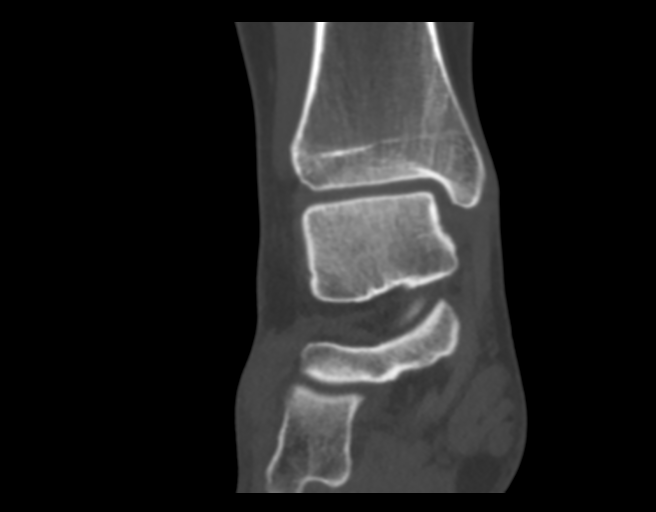
[im 88/147  bone]
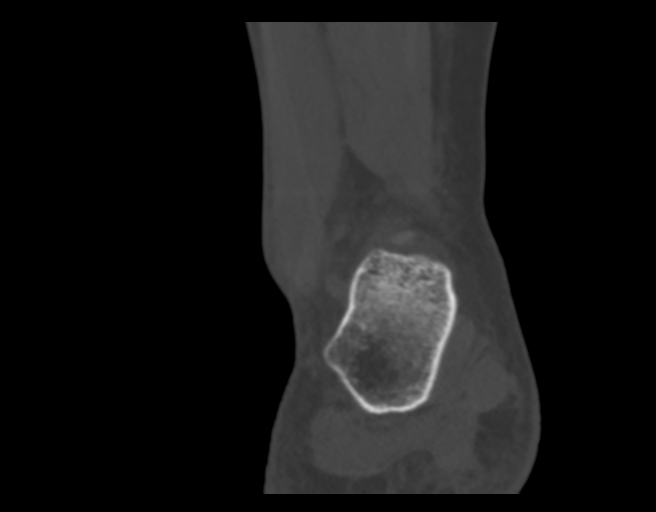

[Series 15: axial st · axial · 0.40mm/px · z∈[-309,-197]mm · 4 of 188 slices shown, 5 images]
[im 38/188  soft-tissue]
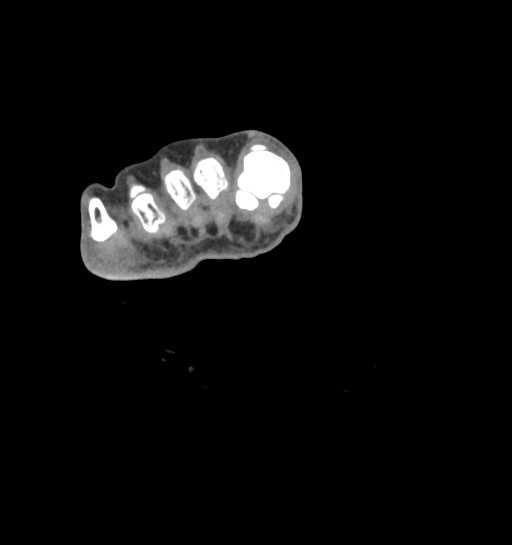
[im 38/188  bone]
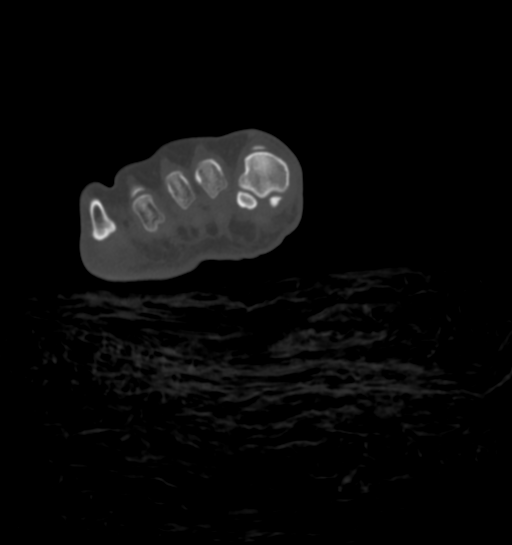
[im 75/188  bone]
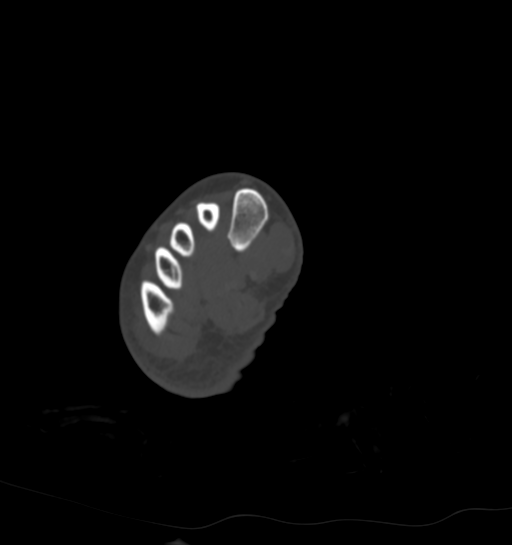
[im 113/188  bone]
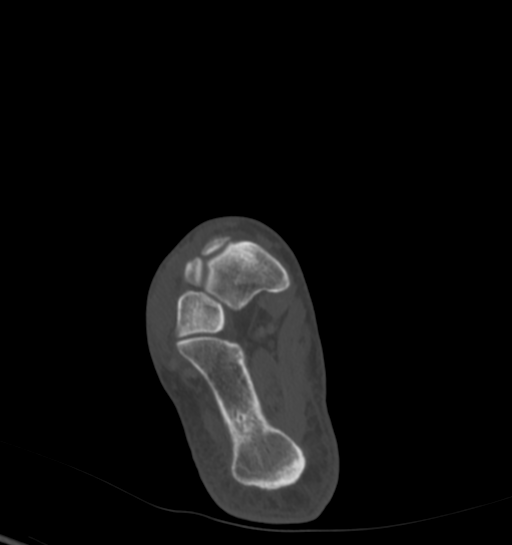
[im 150/188  bone]
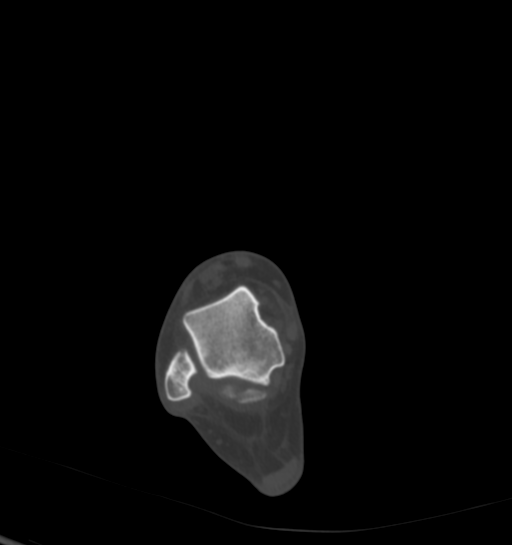

[Series 19: sag st · sagittal · 0.34mm/px · 5 of 132 slices shown]
[im 19/132  bone]
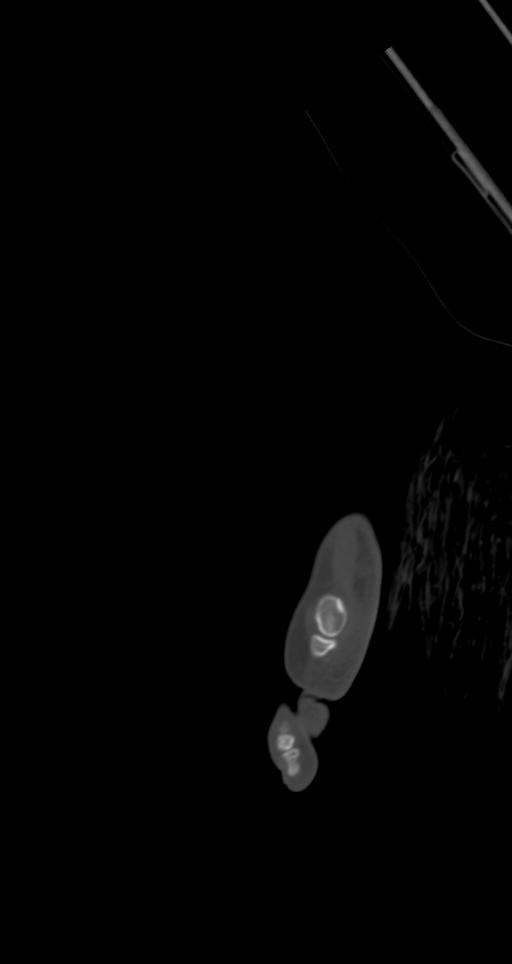
[im 38/132  bone]
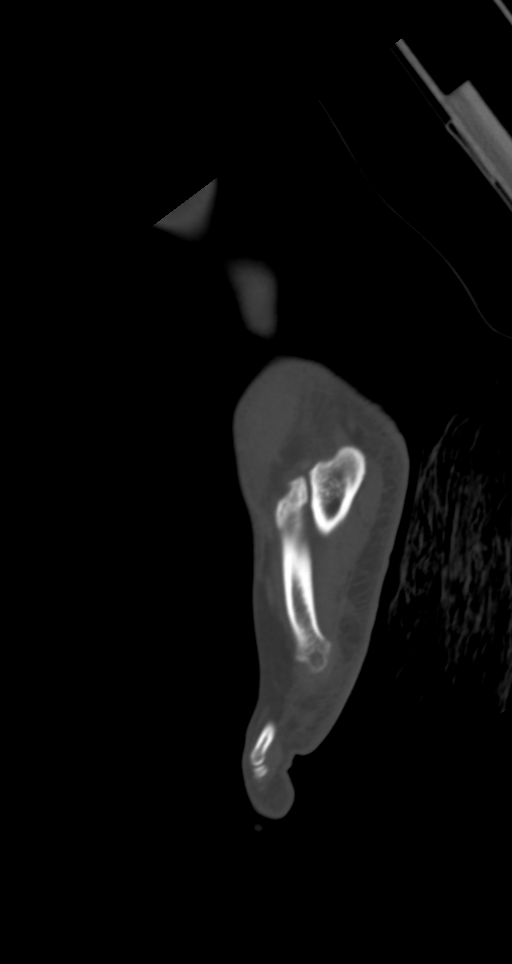
[im 57/132  bone]
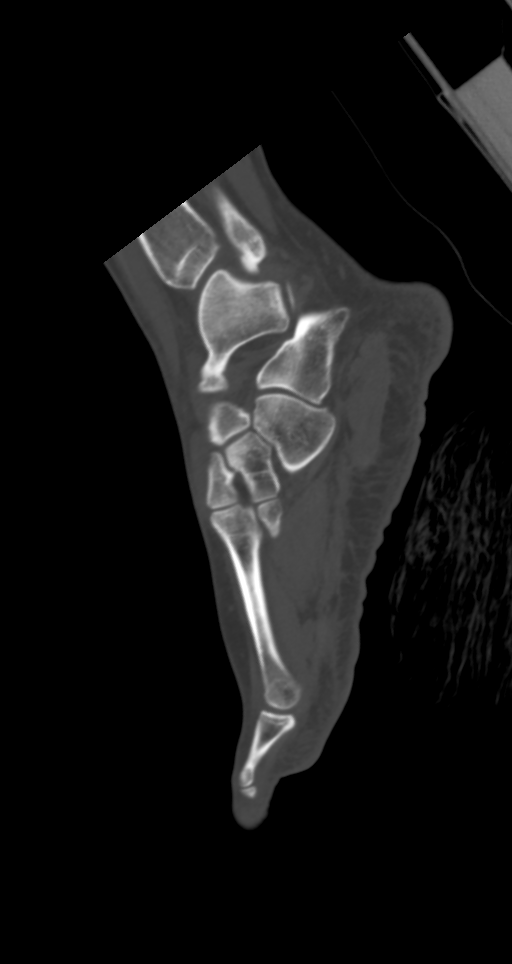
[im 75/132  bone]
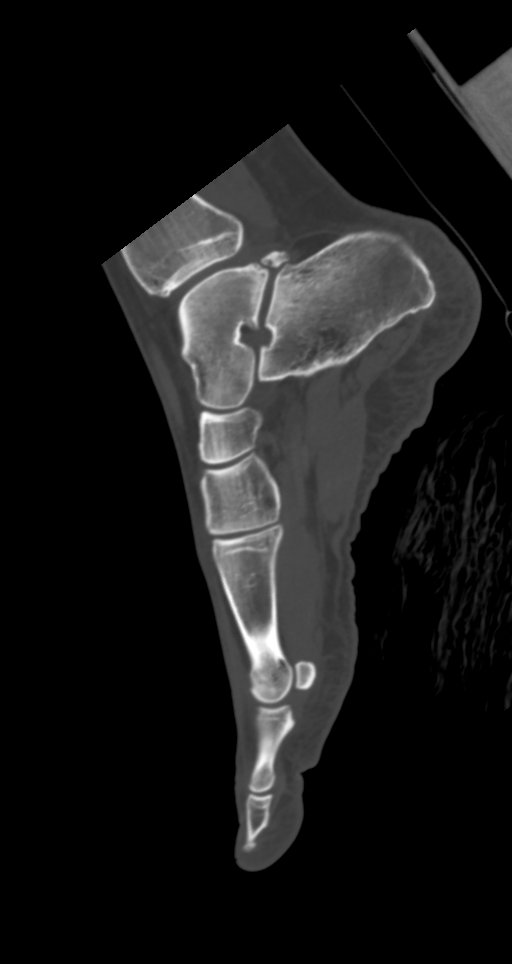
[im 94/132  bone]
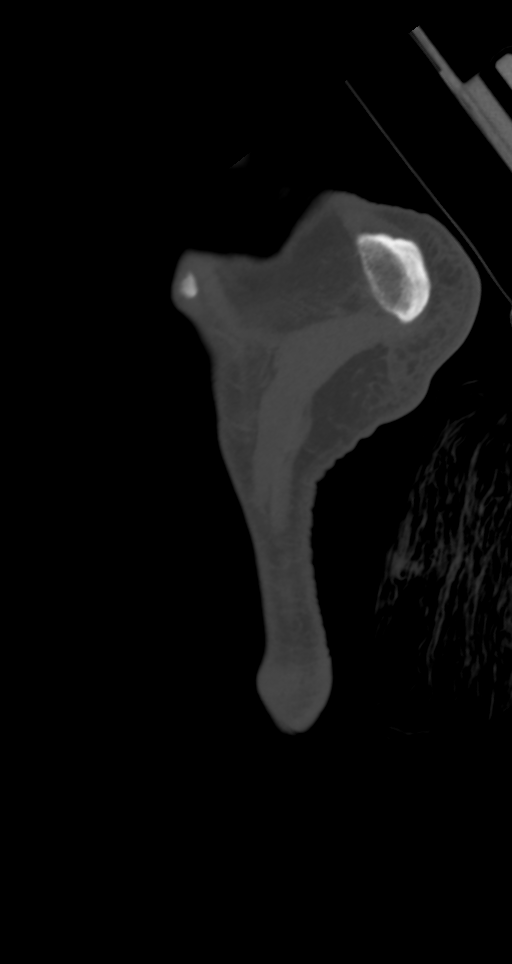

[12 of 33 positions shown; findings below may reference images not displayed]

FINDINGS: The mineralization alignment are normal. There is no evidence of
acute fracture or dislocation. Specifically, the cuneiform bones
appear normal. The alignment is normal at the Lisfranc joint.

Patient does have a prominent os trigonum which has sclerotic
margins. There is no surrounding soft tissue edema or significant
ankle joint effusion. A small amount of fluid is present within the
flexor hallucis longus tendon sheath, but no intrinsic tendon
abnormality identified. The additional ankle tendons appear normal.
The lateral ankle ligaments appear intact.

There is some subcutaneous edema in the plantar aspect of the
hindfoot and forefoot which could be posttraumatic.
IMPRESSION: 1. No evidence of acute fracture or dislocation. The cuneiform bones
appear intact.
2. Prominent os trigonum, likely incidental.
3. Nonspecific mild subcutaneous edema in the hindfoot and forefoot.

## 2017-07-29 MED FILL — SYNTHROID 137 MCG TABLET: 137 | 30 days supply | Qty: 30 | Fill #1

## 2017-08-02 DIAGNOSIS — H52223 Regular astigmatism, bilateral: Secondary | ICD-10-CM | POA: Diagnosis not present

## 2017-08-02 DIAGNOSIS — H5213 Myopia, bilateral: Secondary | ICD-10-CM | POA: Diagnosis not present

## 2017-08-24 MED FILL — CABERGOLINE 0.5 MG TABS: 0.5 | 28 days supply | Qty: 8 | Fill #1

## 2017-08-26 DIAGNOSIS — E221 Hyperprolactinemia: Secondary | ICD-10-CM | POA: Diagnosis not present

## 2017-08-26 DIAGNOSIS — E039 Hypothyroidism, unspecified: Secondary | ICD-10-CM | POA: Diagnosis not present

## 2017-08-27 MED FILL — SYNTHROID 137 MCG TABLET: 137 | 30 days supply | Qty: 30 | Fill #2

## 2017-09-29 MED FILL — SYNTHROID 137 MCG TABLET: 137 | 30 days supply | Qty: 30 | Fill #3

## 2017-10-19 MED FILL — CABERGOLINE 0.5 MG TABS: 0.5 | 28 days supply | Qty: 8 | Fill #2

## 2017-10-27 MED FILL — SYNTHROID 137 MCG TABLET: 137 | 30 days supply | Qty: 30 | Fill #0

## 2017-11-19 MED FILL — CABERGOLINE 0.5 MG TABS: 0.5 | 28 days supply | Qty: 8 | Fill #0

## 2017-11-25 DIAGNOSIS — D443 Neoplasm of uncertain behavior of pituitary gland: Secondary | ICD-10-CM | POA: Diagnosis not present

## 2017-11-25 DIAGNOSIS — E039 Hypothyroidism, unspecified: Secondary | ICD-10-CM | POA: Diagnosis not present

## 2017-11-25 DIAGNOSIS — E063 Autoimmune thyroiditis: Secondary | ICD-10-CM | POA: Diagnosis not present

## 2017-11-25 DIAGNOSIS — E221 Hyperprolactinemia: Secondary | ICD-10-CM | POA: Diagnosis not present

## 2017-11-26 MED FILL — SYNTHROID 137 MCG TABLET: 137 | 30 days supply | Qty: 30 | Fill #1

## 2017-12-26 MED FILL — SYNTHROID 137 MCG TABLET: 137 | 30 days supply | Qty: 30 | Fill #2

## 2017-12-26 MED FILL — CABERGOLINE 0.5 MG TABS: 0.5 | 28 days supply | Qty: 8 | Fill #1

## 2018-01-28 MED FILL — SYNTHROID 137 MCG TABLET: 137 | 30 days supply | Qty: 30 | Fill #3

## 2018-01-28 MED FILL — CABERGOLINE 0.5 MG TABS: 0.5 | 28 days supply | Qty: 8 | Fill #2

## 2018-03-02 MED FILL — SYNTHROID 137 MCG TABLET: 137 | 30 days supply | Qty: 30 | Fill #4

## 2018-03-02 MED FILL — CABERGOLINE 0.5 MG TABS: 0.5 | 28 days supply | Qty: 8 | Fill #3

## 2018-03-30 MED FILL — SYNTHROID 137 MCG TABLET: 137 | 30 days supply | Qty: 30 | Fill #5

## 2018-03-30 MED FILL — CABERGOLINE 0.5 MG TABS: 0.5 | 28 days supply | Qty: 8 | Fill #4

## 2018-05-02 MED FILL — SYNTHROID 137 MCG TABLET: 137 | 30 days supply | Qty: 30 | Fill #6

## 2018-05-12 ENCOUNTER — Other Ambulatory Visit: Payer: Self-pay | Admitting: Endocrinology

## 2018-05-12 DIAGNOSIS — D443 Neoplasm of uncertain behavior of pituitary gland: Secondary | ICD-10-CM

## 2018-05-17 MED FILL — CABERGOLINE 0.5 MG TABS: 0.5 | 28 days supply | Qty: 8 | Fill #5

## 2018-05-27 DIAGNOSIS — E063 Autoimmune thyroiditis: Secondary | ICD-10-CM | POA: Diagnosis not present

## 2018-05-27 DIAGNOSIS — N926 Irregular menstruation, unspecified: Secondary | ICD-10-CM | POA: Diagnosis not present

## 2018-05-27 DIAGNOSIS — E221 Hyperprolactinemia: Secondary | ICD-10-CM | POA: Diagnosis not present

## 2018-05-27 DIAGNOSIS — E039 Hypothyroidism, unspecified: Secondary | ICD-10-CM | POA: Diagnosis not present

## 2018-05-27 DIAGNOSIS — D443 Neoplasm of uncertain behavior of pituitary gland: Secondary | ICD-10-CM | POA: Diagnosis not present

## 2018-05-30 MED FILL — SYNTHROID 150 MCG TABLET: 150 | 30 days supply | Qty: 30 | Fill #0

## 2018-05-31 ENCOUNTER — Other Ambulatory Visit: Payer: Self-pay

## 2018-06-06 DIAGNOSIS — Z3201 Encounter for pregnancy test, result positive: Secondary | ICD-10-CM | POA: Diagnosis not present

## 2018-06-06 DIAGNOSIS — E038 Other specified hypothyroidism: Secondary | ICD-10-CM | POA: Diagnosis not present

## 2018-06-24 MED FILL — SYNTHROID 150 MCG TABLET: 150 | 30 days supply | Qty: 30 | Fill #1

## 2018-07-05 DIAGNOSIS — Z3689 Encounter for other specified antenatal screening: Secondary | ICD-10-CM | POA: Diagnosis not present

## 2018-07-05 DIAGNOSIS — Z1151 Encounter for screening for human papillomavirus (HPV): Secondary | ICD-10-CM | POA: Diagnosis not present

## 2018-07-05 DIAGNOSIS — Z118 Encounter for screening for other infectious and parasitic diseases: Secondary | ICD-10-CM | POA: Diagnosis not present

## 2018-07-05 DIAGNOSIS — Z3481 Encounter for supervision of other normal pregnancy, first trimester: Secondary | ICD-10-CM | POA: Diagnosis not present

## 2018-07-05 LAB — OB RESULTS CONSOLE HIV ANTIBODY (ROUTINE TESTING): HIV: NONREACTIVE

## 2018-07-05 LAB — OB RESULTS CONSOLE GC/CHLAMYDIA
Chlamydia: NEGATIVE
Gonorrhea: NEGATIVE

## 2018-07-05 LAB — OB RESULTS CONSOLE ABO/RH: RH Type: POSITIVE

## 2018-07-05 LAB — OB RESULTS CONSOLE RPR: RPR: NONREACTIVE

## 2018-07-05 LAB — OB RESULTS CONSOLE RUBELLA ANTIBODY, IGM: Rubella: IMMUNE

## 2018-07-05 LAB — OB RESULTS CONSOLE ANTIBODY SCREEN: Antibody Screen: NEGATIVE

## 2018-07-05 LAB — OB RESULTS CONSOLE HEPATITIS B SURFACE ANTIGEN: Hepatitis B Surface Ag: NEGATIVE

## 2018-07-28 MED FILL — SYNTHROID 150 MCG TABLET: 150 | 30 days supply | Qty: 30 | Fill #2

## 2018-08-05 ENCOUNTER — Ambulatory Visit (INDEPENDENT_AMBULATORY_CARE_PROVIDER_SITE_OTHER): Payer: 59 | Admitting: Family Medicine

## 2018-08-05 ENCOUNTER — Other Ambulatory Visit: Payer: Self-pay

## 2018-08-05 ENCOUNTER — Encounter: Payer: Self-pay | Admitting: Family Medicine

## 2018-08-05 VITALS — Wt 160.0 lb

## 2018-08-05 DIAGNOSIS — D352 Benign neoplasm of pituitary gland: Secondary | ICD-10-CM

## 2018-08-05 DIAGNOSIS — Z3A16 16 weeks gestation of pregnancy: Secondary | ICD-10-CM

## 2018-08-05 DIAGNOSIS — E039 Hypothyroidism, unspecified: Secondary | ICD-10-CM

## 2018-08-05 NOTE — Progress Notes (Signed)
Virtual Visit via Video Note  I connected with Cynthia Love on 08/05/18 at  1:00 PM EDT by a video enabled telemedicine application and verified that I am speaking with the correct person using two identifiers.  Location patient: home Location provider:work office Persons participating in the virtual visit: patient, provider  I discussed the limitations of evaluation and management by telemedicine and the availability of in person appointments. The patient expressed understanding and agreed to proceed.   Cynthia Love DOB: Feb 14, 1986 Encounter date: 08/05/2018  This is a 33 y.o. female who presents to establish care. Chief Complaint  Patient presents with  . Establish Care    transfer from Dr Maudie Mercury    History of present illness: Doing well; no specific concerns.   Hypothyroid: on synthroid Pituitary adenoma: managed by Dr. Carlis Abbott and now Dr. Drucilla Chalet, endo  Sees gyn: she is pregnant and about [redacted] weeks along right now. Doing well. This is her 3rd pregnancy - has a 34 yr old and 34 yr old at home. She finds out next month about gender. Due date: Oct 25th.   Sees endocrinology (Dr. Suzette Battiest)  Migraines: no issues with this lately; had more earlier in pregnancy.   Had postpartum depression after first delivery. Mood is doing very well right now. After first child there was more stress, worried more with nursing duties, husband's family didn't speak english; just a lot of stressors.   She actually just resigned from work last week. This was plan anyways after 3rd child, but moved up due to [place].   Sees eye doc regularly.   Does walk regularly, does aerobics regularly.    Past Medical History:  Diagnosis Date  . Headache(784.0)    MIGRAINE 1-2 X MONTH;  OTC MED  . Hypothyroidism 2010  . Infection    UTI X 1  . Postpartum depression 04/19/2012   Past Surgical History:  Procedure Laterality Date  . NO PAST SURGERIES     Allergies  Allergen Reactions  . Nickel Rash    Rash    Current Meds  Medication Sig  . Prenatal Vit-Fe Fumarate-FA (PRENATAL MULTIVITAMIN) TABS Take 1 tablet by mouth daily.  Marland Kitchen SYNTHROID 150 MCG tablet    Social History   Tobacco Use  . Smoking status: Never Smoker  . Smokeless tobacco: Never Used  Substance Use Topics  . Alcohol use: No    Comment: 1-2 X/MONTH   Family History  Problem Relation Age of Onset  . Hashimoto's thyroiditis Mother   . Glaucoma Mother   . Heart disease Father        MYCOCARDIAL INFARCTION  . Early death Father 49  . Hyperlipidemia Father   . Hypertension Father   . Kidney disease Father        DIALYSIS  . Diabetes Father   . Schizophrenia Maternal Aunt   . Cancer Maternal Grandmother        PANCREATIC  . Cancer Maternal Grandfather        NON HODGKINS LYNMPHOMA  . Heart disease Paternal Grandmother   . Heart disease Paternal Grandfather   . High blood pressure Brother   . Rheum arthritis Brother        knee pain following strep infection as child     Review of Systems  Constitutional: Negative for chills, fatigue and fever.  Respiratory: Negative for cough, chest tightness, shortness of breath and wheezing.   Cardiovascular: Negative for chest pain, palpitations and leg swelling.    Objective:  Wt 160 lb (72.6 kg) Comment: per pt taken at appt with GYN-jaf  BMI 28.34 kg/m   Weight: 160 lb (72.6 kg)(per pt taken at appt with GYN-jaf)   BP Readings from Last 3 Encounters:  11/30/16 118/72  04/02/16 (!) 118/58  07/11/15 130/85   Wt Readings from Last 3 Encounters:  08/05/18 160 lb (72.6 kg)  11/30/16 181 lb 6.4 oz (82.3 kg)  03/31/16 208 lb (94.3 kg)    EXAM:  GENERAL: alert, oriented, appears well and in no acute distress  HEENT: atraumatic, conjunctiva clear, no obvious abnormalities on inspection of external nose and ears  NECK: normal movements of the head and neck  LUNGS: on inspection no signs of respiratory distress, breathing rate appears normal, no obvious gross  SOB, gasping or wheezing  CV: no obvious cyanosis  MS: moves all visible extremities without noticeable abnormality  PSYCH/NEURO: pleasant and cooperative, no obvious depression or anxiety, speech and thought processing grossly intact  SKIN: no facial or neck skin abnormalities.  Assessment/Plan 1. Hypothyroidism, unspecified type Followed by endo.   2. Pituitary adenoma ([place]) Followed by endo.  3. [redacted] weeks gestation of pregnancy Following with gynecology. She is staying active, no current associated medical issues.   Return for physical exam in fall-winter.     I discussed the assessment and treatment plan with the patient. The patient was provided an opportunity to ask questions and all were answered. The patient agreed with the plan and demonstrated an understanding of the instructions.   The patient was advised to call back or seek an in-person evaluation if the symptoms worsen or if the condition fails to improve as anticipated.  I provided 18 minutes of non-face-to-face time during this encounter.   Micheline Rough, MD

## 2018-08-18 DIAGNOSIS — E039 Hypothyroidism, unspecified: Secondary | ICD-10-CM | POA: Diagnosis not present

## 2018-08-29 MED FILL — SYNTHROID 175 MCG TABLET: 175 | 30 days supply | Qty: 30 | Fill #0

## 2018-09-06 ENCOUNTER — Other Ambulatory Visit: Payer: Self-pay | Admitting: Obstetrics and Gynecology

## 2018-09-21 MED FILL — SYNTHROID 200 MCG TABLET: 200 | 30 days supply | Qty: 30 | Fill #0

## 2019-01-02 ENCOUNTER — Telehealth (HOSPITAL_COMMUNITY): Payer: Self-pay | Admitting: *Deleted

## 2019-01-02 ENCOUNTER — Encounter (HOSPITAL_COMMUNITY): Payer: Self-pay | Admitting: *Deleted

## 2019-01-02 NOTE — Telephone Encounter (Signed)
Preadmission screen  

## 2019-01-06 ENCOUNTER — Other Ambulatory Visit: Payer: Self-pay | Admitting: Obstetrics and Gynecology

## 2019-01-08 ENCOUNTER — Other Ambulatory Visit (HOSPITAL_COMMUNITY)
Admission: RE | Admit: 2019-01-08 | Discharge: 2019-01-08 | Disposition: A | Discharge status: Home / Self Care | Payer: 59 | Source: Ambulatory Visit | Attending: Obstetrics and Gynecology | Admitting: Obstetrics and Gynecology

## 2019-01-08 ENCOUNTER — Other Ambulatory Visit: Payer: Self-pay

## 2019-01-08 DIAGNOSIS — Z01812 Encounter for preprocedural laboratory examination: Secondary | ICD-10-CM | POA: Insufficient documentation

## 2019-01-08 DIAGNOSIS — Z20828 Contact with and (suspected) exposure to other viral communicable diseases: Secondary | ICD-10-CM | POA: Insufficient documentation

## 2019-01-08 LAB — SARS CORONAVIRUS 2 BY RT PCR (HOSPITAL ORDER, PERFORMED IN ~~LOC~~ HOSPITAL LAB): SARS Coronavirus 2: NEGATIVE

## 2019-01-08 NOTE — MAU Note (Signed)
Pt here for PAT covid swab, denies symptoms. Swab collected.

## 2019-01-09 ENCOUNTER — Other Ambulatory Visit: Payer: Self-pay | Admitting: Obstetrics and Gynecology

## 2019-01-10 ENCOUNTER — Inpatient Hospital Stay (HOSPITAL_COMMUNITY)
Admission: RE | Admit: 2019-01-10 | Discharge: 2019-01-12 | DRG: 807 | Disposition: A | Discharge status: Home / Self Care | Payer: 59 | Attending: Obstetrics and Gynecology | Admitting: Obstetrics and Gynecology

## 2019-01-10 ENCOUNTER — Inpatient Hospital Stay (HOSPITAL_COMMUNITY): Payer: 59

## 2019-01-10 ENCOUNTER — Other Ambulatory Visit: Payer: Self-pay

## 2019-01-10 ENCOUNTER — Encounter (HOSPITAL_COMMUNITY): Payer: Self-pay | Admitting: *Deleted

## 2019-01-10 DIAGNOSIS — Z3A39 39 weeks gestation of pregnancy: Secondary | ICD-10-CM | POA: Diagnosis not present

## 2019-01-10 DIAGNOSIS — O9912 Other diseases of the blood and blood-forming organs and certain disorders involving the immune mechanism complicating childbirth: Principal | ICD-10-CM | POA: Diagnosis present

## 2019-01-10 DIAGNOSIS — O326XX0 Maternal care for compound presentation, not applicable or unspecified: Secondary | ICD-10-CM | POA: Diagnosis present

## 2019-01-10 DIAGNOSIS — Z20828 Contact with and (suspected) exposure to other viral communicable diseases: Secondary | ICD-10-CM | POA: Diagnosis present

## 2019-01-10 DIAGNOSIS — E039 Hypothyroidism, unspecified: Secondary | ICD-10-CM | POA: Diagnosis present

## 2019-01-10 DIAGNOSIS — D6959 Other secondary thrombocytopenia: Secondary | ICD-10-CM | POA: Diagnosis present

## 2019-01-10 DIAGNOSIS — Z3490 Encounter for supervision of normal pregnancy, unspecified, unspecified trimester: Secondary | ICD-10-CM | POA: Diagnosis present

## 2019-01-10 DIAGNOSIS — O26893 Other specified pregnancy related conditions, third trimester: Secondary | ICD-10-CM | POA: Diagnosis present

## 2019-01-10 DIAGNOSIS — O99284 Endocrine, nutritional and metabolic diseases complicating childbirth: Secondary | ICD-10-CM | POA: Diagnosis present

## 2019-01-10 HISTORY — DX: Unspecified abnormal cytological findings in specimens from vagina: R87.629

## 2019-01-10 LAB — CBC
HCT: 38.7 % (ref 36.0–46.0)
Hemoglobin: 12.9 g/dL (ref 12.0–15.0)
MCH: 29.9 pg (ref 26.0–34.0)
MCHC: 33.3 g/dL (ref 30.0–36.0)
MCV: 89.6 fL (ref 80.0–100.0)
Platelets: 119 10*3/uL — ABNORMAL LOW (ref 150–400)
RBC: 4.32 MIL/uL (ref 3.87–5.11)
RDW: 13 % (ref 11.5–15.5)
WBC: 10.9 10*3/uL — ABNORMAL HIGH (ref 4.0–10.5)
nRBC: 0 % (ref 0.0–0.2)

## 2019-01-10 LAB — TYPE AND SCREEN
ABO/RH(D): AB POS
Antibody Screen: NEGATIVE

## 2019-01-10 LAB — ABO/RH: ABO/RH(D): AB POS

## 2019-01-10 MED ORDER — DIPHENHYDRAMINE HCL 50 MG/ML IJ SOLN: 12.5000 mg | INTRAMUSCULAR | Status: DC | PRN

## 2019-01-10 MED ORDER — SENNOSIDES-DOCUSATE SODIUM 8.6-50 MG PO TABS
2.0000 | ORAL_TABLET | ORAL | Status: DC
  Administered 2019-01-10 – 2019-01-12 (×2): 2 via ORAL
  Filled 2019-01-10 (×2): qty 2

## 2019-01-10 MED ORDER — LACTATED RINGERS IV SOLN: 500.0000 mL | INTRAVENOUS | Status: DC | PRN

## 2019-01-10 MED ORDER — PRENATAL MULTIVITAMIN CH
1.0000 | ORAL_TABLET | Freq: Every day | ORAL | Status: DC
  Administered 2019-01-11 – 2019-01-12 (×2): 1 via ORAL
  Filled 2019-01-10 (×2): qty 1

## 2019-01-10 MED ORDER — EPHEDRINE 5 MG/ML INJ: 10.0000 mg | INTRAVENOUS | Status: DC | PRN

## 2019-01-10 MED ORDER — SIMETHICONE 80 MG PO CHEW: 80.0000 mg | CHEWABLE_TABLET | ORAL | Status: DC | PRN

## 2019-01-10 MED ORDER — ACETAMINOPHEN 325 MG PO TABS
650.0000 mg | ORAL_TABLET | ORAL | Status: DC | PRN
  Administered 2019-01-11 (×2): 650 mg via ORAL
  Filled 2019-01-10 (×2): qty 2

## 2019-01-10 MED ORDER — ONDANSETRON HCL 4 MG/2ML IJ SOLN: 4.0000 mg | Freq: Four times a day (QID) | INTRAMUSCULAR | Status: DC | PRN

## 2019-01-10 MED ORDER — WITCH HAZEL-GLYCERIN EX PADS: 1.0000 "application " | MEDICATED_PAD | CUTANEOUS | Status: DC | PRN

## 2019-01-10 MED ORDER — TETANUS-DIPHTH-ACELL PERTUSSIS 5-2.5-18.5 LF-MCG/0.5 IM SUSP: 0.5000 mL | Freq: Once | INTRAMUSCULAR | Status: DC

## 2019-01-10 MED ORDER — LACTATED RINGERS IV SOLN
INTRAVENOUS | Status: DC
  Administered 2019-01-10: 11:00:00 via INTRAVENOUS

## 2019-01-10 MED ORDER — DIBUCAINE (PERIANAL) 1 % EX OINT: 1.0000 "application " | TOPICAL_OINTMENT | CUTANEOUS | Status: DC | PRN

## 2019-01-10 MED ORDER — SOD CITRATE-CITRIC ACID 500-334 MG/5ML PO SOLN: 30.0000 mL | ORAL | Status: DC | PRN

## 2019-01-10 MED ORDER — ONDANSETRON HCL 4 MG PO TABS: 4.0000 mg | ORAL_TABLET | ORAL | Status: DC | PRN

## 2019-01-10 MED ORDER — COCONUT OIL OIL: 1.0000 "application " | TOPICAL_OIL | Status: DC | PRN

## 2019-01-10 MED ORDER — PHENYLEPHRINE 40 MCG/ML (10ML) SYRINGE FOR IV PUSH (FOR BLOOD PRESSURE SUPPORT): 80.0000 ug | PREFILLED_SYRINGE | INTRAVENOUS | Status: DC | PRN

## 2019-01-10 MED ORDER — LIDOCAINE HCL (PF) 1 % IJ SOLN: 30.0000 mL | INTRAMUSCULAR | Status: DC | PRN

## 2019-01-10 MED ORDER — SODIUM CHLORIDE 0.9 % IV SOLN
5.0000 10*6.[IU] | Freq: Once | INTRAVENOUS | Status: AC
  Administered 2019-01-10: 11:00:00 5 10*6.[IU] via INTRAVENOUS
  Filled 2019-01-10: qty 5

## 2019-01-10 MED ORDER — LEVOTHYROXINE SODIUM 100 MCG PO TABS
200.0000 ug | ORAL_TABLET | Freq: Every day | ORAL | Status: DC
  Administered 2019-01-11 – 2019-01-12 (×2): 200 ug via ORAL
  Filled 2019-01-10 (×2): qty 2

## 2019-01-10 MED ORDER — DIPHENHYDRAMINE HCL 25 MG PO CAPS: 25.0000 mg | ORAL_CAPSULE | Freq: Four times a day (QID) | ORAL | Status: DC | PRN

## 2019-01-10 MED ORDER — IBUPROFEN 600 MG PO TABS
600.0000 mg | ORAL_TABLET | Freq: Four times a day (QID) | ORAL | Status: DC
  Administered 2019-01-10 – 2019-01-12 (×7): 600 mg via ORAL
  Filled 2019-01-10 (×6): qty 1

## 2019-01-10 MED ORDER — BENZOCAINE-MENTHOL 20-0.5 % EX AERO: 1.0000 "application " | INHALATION_SPRAY | CUTANEOUS | Status: DC | PRN

## 2019-01-10 MED ORDER — LACTATED RINGERS IV SOLN: 500.0000 mL | Freq: Once | INTRAVENOUS | Status: DC

## 2019-01-10 MED ORDER — ONDANSETRON HCL 4 MG/2ML IJ SOLN: 4.0000 mg | INTRAMUSCULAR | Status: DC | PRN

## 2019-01-10 MED ORDER — OXYTOCIN BOLUS FROM INFUSION
500.0000 mL | Freq: Once | INTRAVENOUS | Status: AC
  Administered 2019-01-10: 19:00:00 500 mL via INTRAVENOUS

## 2019-01-10 MED ORDER — TERBUTALINE SULFATE 1 MG/ML IJ SOLN: 0.2500 mg | Freq: Once | INTRAMUSCULAR | Status: DC | PRN

## 2019-01-10 MED ORDER — FENTANYL-BUPIVACAINE-NACL 0.5-0.125-0.9 MG/250ML-% EP SOLN: 12.0000 mL/h | EPIDURAL | Status: DC | PRN

## 2019-01-10 MED ORDER — ACETAMINOPHEN 325 MG PO TABS: 650.0000 mg | ORAL_TABLET | ORAL | Status: DC | PRN

## 2019-01-10 MED ORDER — OXYTOCIN 40 UNITS IN NORMAL SALINE INFUSION - SIMPLE MED
1.0000 m[IU]/min | INTRAVENOUS | Status: DC
  Administered 2019-01-10: 2 m[IU]/min via INTRAVENOUS
  Filled 2019-01-10: qty 1000

## 2019-01-10 MED ORDER — PENICILLIN G 3 MILLION UNITS IVPB - SIMPLE MED
3.0000 10*6.[IU] | INTRAVENOUS | Status: DC
  Administered 2019-01-10 (×2): 3 10*6.[IU] via INTRAVENOUS
  Filled 2019-01-10 (×2): qty 100

## 2019-01-10 MED ORDER — OXYTOCIN 40 UNITS IN NORMAL SALINE INFUSION - SIMPLE MED: 2.5000 [IU]/h | INTRAVENOUS | Status: DC

## 2019-01-10 NOTE — Plan of Care (Signed)
  Problem: Education: Goal: Knowledge of condition will improve Outcome: Progressing   Problem: Education: Goal: Individualized Educational Video(s) Outcome: Progressing   Problem: Education: Goal: Individualized Newborn Educational Video(s) Outcome: Progressing   Problem: Activity: Goal: Will verbalize the importance of balancing activity with adequate rest periods Outcome: Progressing

## 2019-01-10 NOTE — H&P (Addendum)
Cynthia Love is a 33 y.o. [phone] at [redacted]w[redacted]d gestation presents for complaint of IOL.  H/o hashimoto's and hypothyroid, stable on synthroid.  H/o neonate with GBS sepsis and plan for GBS prophylaxis.  H/o rapid labors.   Antepartum course: hashimoto's/hypothyroid - followed by endocrinology, on synthroid, normal growth u/s and antenatal testing; h/o gbs sepsis with prior child, plan prophylaxis ; last efw: 37.5: 74% (7'5") [facility] at [place] since 12 wks.  See complete pre-natal records  History OB History    Gravida  4   Para  2   Term  2   Preterm      AB  1   Living  2     SAB      TAB      Ectopic  1   Multiple  0   Live Births  2          Past Medical History:  Diagnosis Date  . H/O: pituitary tumor   . Headache(784.0)    MIGRAINE 1-2 X MONTH;  OTC MED  . Hypothyroidism 2010  . Infection    UTI X 1  . Postpartum depression 04/19/2012  . Vaginal Pap smear, abnormal    Past Surgical History:  Procedure Laterality Date  . NO PAST SURGERIES     Family History: family history includes Cancer in her maternal grandfather and maternal grandmother; Diabetes in her father; Early death (age of onset: 55) in her father; Glaucoma in her mother; Hashimoto's thyroiditis in her mother; Heart disease in her father, paternal grandfather, and paternal grandmother; High blood pressure in her brother; Hyperlipidemia in her father; Hypertension in her father; Kidney disease in her father; Schizophrenia in her maternal aunt. Social History:  reports that she has never smoked. She has never used smokeless tobacco. She reports that she does not drink alcohol or use drugs.  ROS: See above otherwise negative  Prenatal labs:  ABO, Rh: --/--/AB POS, AB POS Performed at [facility], [address] [place], [place] [address]  [phone]/20 1055) Antibody: NEG (10/20 1055) Rubella: Immune (04/14 0000) RPR: Nonreactive (04/14 0000)  HBsAg: Negative (04/14 0000)   HIV:Non-reactive (04/14 0000)  GBS:   plan prophylaxis; test not done d/t prior h/o gbs sepsis 1 hr Glucola: Normal Genetic screening: Normal Anatomy US: Normal  Physical Exam:   Dilation: 5 Effacement (%): 60 Station: -2 Exam by:: J.Cox, RN Blood pressure 125/71, pulse 97, temperature 98.3 F (36.8 C), temperature source Oral, resp. rate 16, height 5\' 2"  (1.575 m), weight 87.1 kg, unknown if currently breastfeeding. A&O x 3 HEENT: Normal Lungs: CTAB CV: RRR Abdominal: Soft, Non-tender, Gravid and Estimated fetal weight: 7.5-8lbs lbs  Lower Extremities: Non-edematous, Non-tender  FHT: 140s, nml variability, +accels, no decels Toco: irreg ctx  Labs:  CBC:  Lab Results  Component Value Date   WBC 10.9 (H) 01/10/2019   RBC 4.32 01/10/2019   HGB 12.9 01/10/2019   HCT 38.7 01/10/2019   MCV 89.6 01/10/2019   MCH 29.9 01/10/2019   MCHC 33.3 01/10/2019   RDW 13.0 01/10/2019   PLT 119 (L) 01/10/2019   CMP:  Lab Results  Component Value Date   GLUCOSE 79 12/25/2011   Urine: Lab Results  Component Value Date   PHURINE 4.0 09/18/2011   BILIRUBINUR NEG 09/18/2011   PROTEINUR TRACE 09/18/2011   NITRITE NEG 09/18/2011   LEUKOCYTESUR Negative 09/18/2011     Prenatal Transfer Tool  Maternal Diabetes: No Genetic Screening: Declined Maternal Ultrasounds/Referrals:  Normal Fetal Ultrasounds or other Referrals:  None Maternal Substance Abuse:  No Significant Maternal Medications:  None Significant Maternal Lab Results: None    Assessment/Plan:  33 y.o. [id number] at [redacted]w[redacted]d gestation   1. IOL - plan pitocin IOL, plan svd; begin pit 4 hrs after first pcn dose - ok for pitocin now 2. H/o gbs sepsis - plan abx prior to beginning pitocin 3. H/o rapid labors 4. H/o hasimoto's/hypothyroid - synthroid 5. Gestational thrombocytopenia - new diagnosis, ok for epidural if desired    Charyl Bigger 01/10/2019, 1:52 PM

## 2019-01-10 NOTE — H&P (Signed)
Delivery Note At  a viable and healthy female was delivered over intact perineum via svd  (Presentation:ROA ; compound presentation with left arm ).  APGAR:9 ,9 ; weight  .   Placenta status:delivered spontaneously ,intact .  Cord:3vv,  with the following complications: none.  Anesthesia:  none Episiotomy:  none Lacerations:  small midline periurethral, hemostatic Suture Repair: n/a Est. Blood Loss (mL):  257ml  Mom to postpartum.  Baby to Couplet care / Skin to Skin.  Charyl Bigger 01/10/2019, 7:37 PM

## 2019-01-10 NOTE — Progress Notes (Signed)
Beginning to feel ctx, only occasional is uncomfortable  Temp:  [98.3 F (36.8 C)] 98.3 F (36.8 C) (10/20 1054) Pulse Rate:  [75-108] 75 (10/20 1634) Resp:  [16-18] 16 (10/20 1115) BP: (125-141)/(71-90) 136/71 (10/20 1634) Weight:  [87.1 kg] 87.1 kg (10/20 1054)  A&ox3 nml respirations Abd: soft,nt,nd; gravid Cx: [id number]; cephalic, arom with moderate amount of clear fluid LE: no edema, nt bilat  FHT: 120s, nml variability, +accels, no decels Toco: irreg, q 1-7 min  A/P: iup at 39.2 wga 1. IOL - pitocin now at 13mU/min;  2. H/o gbs sepsis - s/p pcn first dose several hrs ago 3. Hypothyroid 4. Gestational thrombocytopenia

## 2019-01-11 ENCOUNTER — Encounter: Payer: Self-pay | Admitting: Family Medicine

## 2019-01-11 LAB — RPR: RPR Ser Ql: NONREACTIVE

## 2019-01-11 LAB — CBC
HCT: 35.2 % — ABNORMAL LOW (ref 36.0–46.0)
Hemoglobin: 11.8 g/dL — ABNORMAL LOW (ref 12.0–15.0)
MCH: 29.8 pg (ref 26.0–34.0)
MCHC: 33.5 g/dL (ref 30.0–36.0)
MCV: 88.9 fL (ref 80.0–100.0)
Platelets: 104 10*3/uL — ABNORMAL LOW (ref 150–400)
RBC: 3.96 MIL/uL (ref 3.87–5.11)
RDW: 13.2 % (ref 11.5–15.5)
WBC: 12.5 10*3/uL — ABNORMAL HIGH (ref 4.0–10.5)
nRBC: 0 % (ref 0.0–0.2)

## 2019-01-11 NOTE — Progress Notes (Signed)
[name] was referred for history of PPD.  * Referral screened out by Clinical Social Worker because none of the following criteria appear to apply:  ~History of anxiety/depression during this pregnancy, or of post-partum depression following prior delivery. Per chart review, [name] experienced PPD after first delivery due to stressors going on at that time. Mood doing very well during this pregnancy and no PPD noted after second delivery. ~ Diagnosis of anxiety and/or depression within last 3 years OR * [name]'s symptoms currently being treated with medication and/or therapy.  Please contact the Clinical Social Worker if needs arise or by [name] request.   Cynthia Love, [place]  [facility] [employer] [phone]

## 2019-01-11 NOTE — Progress Notes (Signed)
No c/o; normal lochia, breastfeeding  Temp:  [98.2 F (36.8 C)-98.8 F (37.1 C)] 98.2 F (36.8 C) (10/21 0634) Pulse Rate:  [67-108] 77 (10/21 0634) Resp:  [16-20] 16 (10/21 0634) BP: (107-141)/(49-90) 117/70 (10/21 0634) SpO2:  [98 %-100 %] 99 % (10/21 0634) Weight:  [87.1 kg] 87.1 kg (10/20 1054)  A&ox3 nml respirations Abd: soft, nt, nd; fundus 1cm below umb LE: no edema, nt bilat  CBC Latest Ref Rng & Units 01/11/2019 01/10/2019 04/01/2016  WBC 4.0 - 10.5 K/uL 12.5(H) 10.9(H) 13.1(H)  Hemoglobin 12.0 - 15.0 g/dL 11.8(L) 12.9 11.5(L)  Hematocrit 36.0 - 46.0 % 35.2(L) 38.7 33.6(L)  Platelets 150 - 400 K/uL 104(L) 119(L) 148(L)    A/P: ppd1 s/p svd 1. Doing well, contin care; plan d/c home tomorrow 2. Hypothyroid - contin synthroid 3. Gestational thrombocytopenia - basically stable, anticipate return to nml  4. Desires circumcision

## 2019-01-11 NOTE — Lactation Note (Signed)
This note was copied from a baby's chart. Lactation Consultation Note Baby 7 hrs old. Mom states baby is BF well.  Mom states she has a pituitary tumor and she always has milk in her breast. Mom states she's concerned about how much and the quality of milk baby is getting. LC reviewed process of Lactogenesis.  Suggested the importance of I&O to know if the baby is getting enough as well as weight loss.  Mom latching in cradle position. Encouraged un-swaddle feed STS, cover over baby mom worried about it getting cold. Newborn behavior and feeding habits discussed. Mom encouraged to feed baby 8-12 times/24 hours and with feeding cues.  Mom's 1st child was in NICU so she didn't BF, her 2nd child she BF for 2 weeks and stopped d/t staph infection. Mom stated she really wants BF to work this time. Praised mom for good latch and trying hard. Encouraged mom to call for assistance or questions. Lactation brochure given.  Patient Name: Cynthia Love [id number] Date: 01/11/2019 Reason for consult: Initial assessment;Term   Maternal Data Has patient been taught Hand Expression?: Yes Does the patient have breastfeeding experience prior to this delivery?: Yes  Feeding Feeding Type: Breast Fed  LATCH Score Latch: Grasps breast easily, tongue down, lips flanged, rhythmical sucking.  Audible Swallowing: A few with stimulation  Type of Nipple: Everted at rest and after stimulation  Comfort (Breast/Nipple): Soft / non-tender  Hold (Positioning): No assistance needed to correctly position infant at breast.  LATCH Score: 9  Interventions Interventions: Breast feeding basics reviewed;Skin to skin;Breast massage;Breast compression  Lactation Tools Discussed/Used WIC Program: No   Consult Status Consult Status: Follow-up Date: 01/12/19 Follow-up type: In-patient    Anica Alcaraz, [place]ta [place] 01/11/2019, 3:15 AM

## 2019-01-12 MED ORDER — IBUPROFEN 600 MG PO TABS: 600.0000 mg | ORAL_TABLET | Freq: Four times a day (QID) | ORAL | 0 refills | Status: DC

## 2019-01-12 NOTE — Discharge Summary (Signed)
Obstetric Discharge Summary   Patient Name: Cynthia Love DOB: [birth year] MRN: [id number]  Date of Admission: 01/10/2019 Date of Discharge: 01/12/2019 Date of Delivery: 01/10/2019 Gestational Age at Delivery: [redacted]w[redacted]d  Primary OB: [facility] - Dr. Murrell Redden  Antepartum complications:  hashimoto's/hypothyroid - followed by endocrinology, on synthroid, normal growth u/s and antenatal testing; h/o gbs sepsis with prior child, plan prophylaxis ; last efw: 37.5: 74% (35'5") Hx of rapid labors  Gestational thrombocytopenia  Prenatal Labs:  ABO, Rh: --/--/AB POS, AB POS Performed at [facility], [address], [place], [place] [address]  [phone]/20 1055) Antibody: NEG (10/20 1055) Rubella: Immune (04/14 0000) RPR: Nonreactive (04/14 0000)  HBsAg: Negative (04/14 0000)  HIV:Non-reactive (04/14 0000)  GBS:   plan prophylaxis; test not done d/t prior h/o gbs sepsis 1 hr Glucola: Normal Genetic screening: Normal Anatomy US: Normal  Admitting Diagnosis: IOL at 39+2   Secondary Diagnoses: Patient Active Problem List   Diagnosis Date Noted  . Encounter for induction of labor 01/10/2019  . SVD (spontaneous vaginal delivery) 01/10/2019  . Postpartum care following vaginal delivery (10/20) 01/10/2019  . BMI 32.0-32.9,adult 11/30/2016  . Pituitary adenoma ([place]) 11/30/2016  . Hypothyroidism 10/16/2011    Augmentation: Pitocin, AROM Complications: None  Date of Delivery: 01/10/2019 Delivered By: Dr. Murrell Redden Delivery Type: spontaneous vaginal delivery Anesthesia: none Placenta: sponatneous Laceration: small midline periurethral, Episiotomy: none  Newborn Data: Live born female  Birth Weight: 7 lb 11.8 oz (3510 g) APGAR: 9, 9  Newborn Delivery   Birth date/time: 01/10/2019 19:19:00 Delivery type: Vaginal, Spontaneous      Hospital/Postpartum Course  (Vaginal Delivery): Pt. Admitted for IOL at 39+2 weeks for H/o hashimoto's and hypothyroid, stable on synthroid.  H/o  neonate with GBS sepsis and plan for GBS prophylaxis.  H/o rapid labors. See notes and delivery summary for details.  Patient had an uncomplicated postpartum course.  By time of discharge on PPD#2, her pain was controlled on oral pain medications; she had appropriate lochia and was ambulating, voiding without difficulty and tolerating regular diet.  She was deemed stable for discharge to home.     Labs: CBC Latest Ref Rng & Units 01/11/2019 01/10/2019 04/01/2016  WBC 4.0 - 10.5 K/uL 12.5(H) 10.9(H) 13.1(H)  Hemoglobin 12.0 - 15.0 g/dL 11.8(L) 12.9 11.5(L)  Hematocrit 36.0 - 46.0 % 35.2(L) 38.7 33.6(L)  Platelets 150 - 400 K/uL 104(L) [id number]) [id number])   Conflict (See Lab Report): AB POS/AB POS Performed at [facility] [address] [place], [place] [address]   Physical exam:  BP 125/81 (BP Location: Left Arm)   Pulse 64   Temp 97.6 F (36.4 C) (Oral)   Resp 18   Ht 5\' 2"  (1.575 m)   Wt 87.1 kg   SpO2 100%   Breastfeeding Unknown   BMI 35.12 kg/m  General: alert and no distress Pulm: normal respiratory effort Lochia: appropriate Abdomen: soft, NT Uterine Fundus: firm, below umbilicus Perineum: healing well, no significant erythema, no significant edema Extremities: No evidence of DVT seen on physical exam. Trace lower extremity edema.   Disposition: stable, discharge to home Baby Feeding: breast milk Baby Disposition: home with mom   Rh Immune globulin given: N/A Rubella vaccine given: N/A Tdap vaccine given in AP or PP setting: 11/04/2018 Flu vaccine given in AP or PP setting: 12/15/2018   Plan:  Cynthia Love was discharged to home in good condition. Follow-up appointment at [facility] in 6 weeks.  Discharge Instructions: Per  After Visit Summary. Refer to After Visit Summary and [facility] discharge booklet  Activity: Advance as tolerated. Pelvic rest for 6 weeks.   Diet: Regular, Heart Healthy Discharge Medications: Allergies as of 01/12/2019       Reactions   Nickel Rash   Rash      Medication List    TAKE these medications   ibuprofen 600 MG tablet Commonly known as: ADVIL Take 1 tablet (600 mg total) by mouth every 6 (six) hours.   prenatal multivitamin Tabs tablet Take 1 tablet by mouth daily.   Synthroid 200 MCG tablet Generic drug: levothyroxine Take 200 mcg by mouth daily before breakfast.      Outpatient follow up:  Follow-up Information    Murrell Redden Earlyne Iba, MD. Schedule an appointment as soon as possible for a visit in 6 week(s).   Specialty: Obstetrics and Gynecology Why: Postpartum visit Contact information: [place] [address] [phone]           Signed:  Lars Pinks, MSN, CNM [facility]

## 2019-01-12 NOTE — Lactation Note (Signed)
This note was copied from a baby's chart. Lactation Consultation Note  Patient Name: Cynthia Love [id number] Date: 01/12/2019 Reason for consult: Follow-up assessment Baby is 40 hours old/7% weight loss.  Mom states baby has been cluster feeding.  Reassured and discussed how to obtain rest between feeds.  Discussed milk coming to volume and the prevention and treatment of engorgement.  She does have a breast pump at home.  Mom can easily hand express colostrum.  Reviewed outpatient lactation services and encouraged to call prn.  Maternal Data    Feeding Feeding Type: Breast Fed  LATCH Score                   Interventions    Lactation Tools Discussed/Used     Consult Status Consult Status: Complete Follow-up type: Call as needed    Ave Filter 01/12/2019, 12:05 PM

## 2019-07-01 ENCOUNTER — Ambulatory Visit: Payer: 59 | Attending: Internal Medicine

## 2019-07-01 DIAGNOSIS — Z23 Encounter for immunization: Secondary | ICD-10-CM

## 2019-07-01 NOTE — Progress Notes (Signed)
   Covid-19 Vaccination Clinic  Name:  Cynthia Love    MRN: [id number] DOB: 1985/07/24  07/01/2019  Cynthia Love was observed post Covid-19 immunization for 15 minutes without incident. She was provided with Vaccine Information Sheet and instruction to access the V-Safe system.   Cynthia Love was instructed to call 911 with any severe reactions post vaccine: Marland Kitchen Difficulty breathing  . Swelling of face and throat  . A fast heartbeat  . A bad rash all over body  . Dizziness and weakness   Immunizations Administered    Name Date Dose VIS Date Route   Pfizer COVID-19 Vaccine 07/01/2019  8:20 AM 0.3 mL 03/03/2019 Intramuscular   Manufacturer: [place]   Lot: [id number]   [place]: [id number]

## 2019-08-01 ENCOUNTER — Ambulatory Visit: Payer: 59 | Attending: Internal Medicine

## 2019-08-01 DIAGNOSIS — Z23 Encounter for immunization: Secondary | ICD-10-CM

## 2019-08-01 NOTE — Progress Notes (Signed)
   Covid-19 Vaccination Clinic  Name:  Cynthia Love    MRN: [id number] DOB: 28-Oct-1985  08/01/2019  [name] was observed post Covid-19 immunization for 15 minutes without incident. She was provided with Vaccine Information Sheet and instruction to access the V-Safe system.   [name] was instructed to call 911 with any severe reactions post vaccine: Marland Kitchen Difficulty breathing  . Swelling of face and throat  . A fast heartbeat  . A bad rash all over body  . Dizziness and weakness   Immunizations Administered    Name Date Dose VIS Date Route   Pfizer COVID-19 Vaccine 08/01/2019  8:17 AM 0.3 mL 05/17/2018 Intramuscular   Manufacturer: [employer], [employer]   Lot: [id number]   [place]: [id number]

## 2019-12-08 ENCOUNTER — Other Ambulatory Visit: Payer: Self-pay

## 2019-12-08 ENCOUNTER — Ambulatory Visit (INDEPENDENT_AMBULATORY_CARE_PROVIDER_SITE_OTHER): Payer: 59 | Admitting: *Deleted

## 2019-12-08 DIAGNOSIS — Z23 Encounter for immunization: Secondary | ICD-10-CM | POA: Diagnosis not present

## 2020-12-25 ENCOUNTER — Other Ambulatory Visit: Payer: Self-pay

## 2020-12-25 ENCOUNTER — Encounter: Payer: Self-pay | Admitting: Family Medicine

## 2020-12-25 ENCOUNTER — Ambulatory Visit (INDEPENDENT_AMBULATORY_CARE_PROVIDER_SITE_OTHER): Payer: 59 | Admitting: Family Medicine

## 2020-12-25 VITALS — BP 112/60 | HR 66 | Temp 98.3°F | Ht 62.25 in | Wt 152.3 lb

## 2020-12-25 DIAGNOSIS — L409 Psoriasis, unspecified: Secondary | ICD-10-CM | POA: Diagnosis not present

## 2020-12-25 DIAGNOSIS — E039 Hypothyroidism, unspecified: Secondary | ICD-10-CM | POA: Diagnosis not present

## 2020-12-25 DIAGNOSIS — K1379 Other lesions of oral mucosa: Secondary | ICD-10-CM

## 2020-12-25 DIAGNOSIS — Z23 Encounter for immunization: Secondary | ICD-10-CM

## 2020-12-25 DIAGNOSIS — D352 Benign neoplasm of pituitary gland: Secondary | ICD-10-CM

## 2020-12-25 DIAGNOSIS — N920 Excessive and frequent menstruation with regular cycle: Secondary | ICD-10-CM

## 2020-12-25 DIAGNOSIS — Z13220 Encounter for screening for lipoid disorders: Secondary | ICD-10-CM

## 2020-12-25 DIAGNOSIS — Z0000 Encounter for general adult medical examination without abnormal findings: Secondary | ICD-10-CM | POA: Diagnosis not present

## 2020-12-25 DIAGNOSIS — R5383 Other fatigue: Secondary | ICD-10-CM

## 2020-12-25 LAB — IBC + FERRITIN
Ferritin: 27.2 ng/mL (ref 10.0–291.0)
Iron: 92 ug/dL (ref 42–145)
Saturation Ratios: 23.1 % (ref 20.0–50.0)
TIBC: 397.6 ug/dL (ref 250.0–450.0)
Transferrin: 284 mg/dL (ref 212.0–360.0)

## 2020-12-25 LAB — COMPREHENSIVE METABOLIC PANEL
ALT: 15 U/L (ref 0–35)
AST: 20 U/L (ref 0–37)
Albumin: 4.6 g/dL (ref 3.5–5.2)
Alkaline Phosphatase: 30 U/L — ABNORMAL LOW (ref 39–117)
BUN: 14 mg/dL (ref 6–23)
CO2: 30 mEq/L (ref 19–32)
Calcium: 9.6 mg/dL (ref 8.4–10.5)
Chloride: 104 mEq/L (ref 96–112)
Creatinine, Ser: 0.77 mg/dL (ref 0.40–1.20)
GFR: 99.62 mL/min (ref >60.00)
Glucose, Bld: 91 mg/dL (ref 70–99)
Potassium: 3.9 mEq/L (ref 3.5–5.1)
Sodium: 140 mEq/L (ref 135–145)
Total Bilirubin: 0.5 mg/dL (ref 0.2–1.2)
Total Protein: 7.2 g/dL (ref 6.0–8.3)

## 2020-12-25 LAB — LIPID PANEL
Cholesterol: 195 mg/dL (ref 0–200)
HDL: 80.6 mg/dL (ref >39.00)
LDL Cholesterol: 100 mg/dL — ABNORMAL HIGH (ref 0–99)
NonHDL: 114.75
Total CHOL/HDL Ratio: 2
Triglycerides: 74 mg/dL (ref 0.0–149.0)
VLDL: 14.8 mg/dL (ref 0.0–40.0)

## 2020-12-25 LAB — CBC WITH DIFFERENTIAL/PLATELET
Basophils Absolute: 0 10*3/uL (ref 0.0–0.1)
Basophils Relative: 0.4 % (ref 0.0–3.0)
Eosinophils Absolute: 0.1 10*3/uL (ref 0.0–0.7)
Eosinophils Relative: 2.4 % (ref 0.0–5.0)
HCT: 39.8 % (ref 36.0–46.0)
Hemoglobin: 13.3 g/dL (ref 12.0–15.0)
Lymphocytes Relative: 27.1 % (ref 12.0–46.0)
Lymphs Abs: 1.5 10*3/uL (ref 0.7–4.0)
MCHC: 33.4 g/dL (ref 30.0–36.0)
MCV: 86.7 fl (ref 78.0–100.0)
Monocytes Absolute: 0.3 10*3/uL (ref 0.1–1.0)
Monocytes Relative: 6 % (ref 3.0–12.0)
Neutro Abs: 3.5 10*3/uL (ref 1.4–7.7)
Neutrophils Relative %: 64.1 % (ref 43.0–77.0)
Platelets: 175 10*3/uL (ref 150.0–400.0)
RBC: 4.6 Mil/uL (ref 3.87–5.11)
RDW: 12.7 % (ref 11.5–15.5)
WBC: 5.4 10*3/uL (ref 4.0–10.5)

## 2020-12-25 LAB — VITAMIN B12: Vitamin B-12: 551 pg/mL (ref 211–911)

## 2020-12-25 LAB — VITAMIN D 25 HYDROXY (VIT D DEFICIENCY, FRACTURES): VITD: 23.78 ng/mL — ABNORMAL LOW (ref 30.00–100.00)

## 2020-12-25 MED ORDER — FLUOCINOLONE ACETONIDE 0.01 % EX SOLN: Freq: Two times a day (BID) | CUTANEOUS | 0 refills | Status: DC

## 2020-12-25 NOTE — Progress Notes (Signed)
Cynthia Love DOB: 07/12/1985 Encounter date: 12/25/2020  This is a 35 y.o. female who presents for complete physical   History of present illness/Additional concerns:  No specific worries today.   She is still following with endocrinology - monitoring pituitary tumor and thyroid levels. Thyroid has been pretty consistent. She mentioned weight gain at last endo visit. Thyroid medication was adjusted. For pituitary tumor everything has adjusted ok. Normal cycles. Hasn't required scans.   Hasn't seen gynecologist for a little while.   Hasn't had migraine in long time.   Mood a little u and down. Sometimes just more tired, cranky. Anxiety tends to feel worse around cycle. Notes more irritable during that time.   Does have some discomfort with varicose veins, lower legs, behind left knee.    Past Medical History:  Diagnosis Date   H/O: pituitary tumor    Headache(784.0)    MIGRAINE 1-2 X MONTH;  OTC MED   Hypothyroidism 2010   Infection    UTI X 1   Postpartum depression 04/19/2012   Vaginal Pap smear, abnormal    Past Surgical History:  Procedure Laterality Date   NO PAST SURGERIES     Allergies  Allergen Reactions   Nickel Rash    Rash   Current Meds  Medication Sig   fluocinolone (SYNALAR) 0.01 % external solution Apply topically 2 (two) times daily.   levothyroxine (SYNTHROID) 150 MCG tablet    Multiple Vitamin (MULTIVITAMIN) tablet Take 1 tablet by mouth daily.   Social History   Tobacco Use   Smoking status: Never   Smokeless tobacco: Never  Substance Use Topics   Alcohol use: No    Comment: 1-2 X/MONTH   Family History  Problem Relation Age of Onset   Hashimoto's thyroiditis Mother    Glaucoma Mother    Heart disease Father        MYCOCARDIAL INFARCTION   Early death Father 45   Hyperlipidemia Father    Hypertension Father    Kidney disease Father        DIALYSIS   Diabetes Father    Schizophrenia Maternal Aunt    Cancer Maternal Grandmother         PANCREATIC   Cancer Maternal Grandfather        NON HODGKINS LYNMPHOMA   Heart disease Paternal Grandmother    Heart disease Paternal Grandfather    High blood pressure Brother      Review of Systems  Constitutional:  Negative for activity change, appetite change, chills, fatigue, fever and unexpected weight change.  HENT:  Negative for congestion, ear pain, hearing loss, sinus pressure, sinus pain, sore throat and trouble swallowing.   Eyes:  Negative for pain and visual disturbance.  Respiratory:  Negative for cough, chest tightness, shortness of breath and wheezing.   Cardiovascular:  Negative for chest pain, palpitations and leg swelling.  Gastrointestinal:  Negative for abdominal pain, blood in stool, constipation, diarrhea, nausea and vomiting.  Genitourinary:  Negative for difficulty urinating and menstrual problem.  Musculoskeletal:  Negative for arthralgias and back pain.  Skin:  Negative for rash.  Neurological:  Negative for dizziness, weakness, numbness and headaches.  Hematological:  Negative for adenopathy. Does not bruise/bleed easily.  Psychiatric/Behavioral:  Negative for sleep disturbance and suicidal ideas. The patient is not nervous/anxious.    CBC:  Lab Results  Component Value Date   WBC 12.5 (H) 01/11/2019   HGB 11.8 (L) 01/11/2019   HCT 35.2 (L) 01/11/2019   MCH 29.8  01/11/2019   MCHC 33.5 01/11/2019   RDW 13.2 01/11/2019   PLT 104 (L) 01/11/2019   CMP: Lab Results  Component Value Date   GLUCOSE 79 12/25/2011   LIPID: Lab Results  Component Value Date   CHOL 180 12/10/2016   TRIG 54.0 12/10/2016   HDL 71.80 12/10/2016   LDLCALC 97 12/10/2016    Objective:  BP 112/60 (BP Location: Left Arm, Patient Position: Sitting, Cuff Size: Normal)   Pulse 66   Temp 98.3 F (36.8 C) (Oral)   Ht 5' 2.25" (1.581 m)   Wt 152 lb 4.8 oz (69.1 kg)   LMP 12/22/2020 (Exact Date)   SpO2 99%   BMI 27.63 kg/m   Weight: 152 lb 4.8 oz (69.1 kg)   BP  Readings from Last 3 Encounters:  12/25/20 112/60  01/12/19 125/81  04/02/16 (!) 118/58   Wt Readings from Last 3 Encounters:  12/25/20 152 lb 4.8 oz (69.1 kg)  01/10/19 192 lb (87.1 kg)  08/05/18 160 lb (72.6 kg)    Physical Exam Constitutional:      General: She is not in acute distress.    Appearance: She is well-developed.  HENT:     Head: Normocephalic and atraumatic.     Right Ear: External ear normal.     Left Ear: External ear normal.     Mouth/Throat:     Pharynx: No oropharyngeal exudate.  Eyes:     Conjunctiva/sclera: Conjunctivae normal.     Pupils: Pupils are equal, round, and reactive to light.  Neck:     Thyroid: No thyromegaly.  Cardiovascular:     Rate and Rhythm: Normal rate and regular rhythm.     Heart sounds: Normal heart sounds. No murmur heard.   No friction rub. No gallop.  Pulmonary:     Effort: Pulmonary effort is normal.     Breath sounds: Normal breath sounds.  Abdominal:     General: Bowel sounds are normal. There is no distension.     Palpations: Abdomen is soft. There is no mass.     Tenderness: There is no abdominal tenderness. There is no guarding.     Hernia: No hernia is present.  Musculoskeletal:        General: No tenderness or deformity. Normal range of motion.     Cervical back: Normal range of motion and neck supple.  Lymphadenopathy:     Cervical: No cervical adenopathy.  Skin:    General: Skin is warm and dry.     Findings: No rash.     Comments: Varicosities bilat LE; L>R, posterior knee  Dry scaling patches posterior scalp, occiput  Upper left back - dark mole 68mm see image  Neurological:     Mental Status: She is alert and oriented to person, place, and time.     Deep Tendon Reflexes: Reflexes normal.     Reflex Scores:      Tricep reflexes are 2+ on the right side and 2+ on the left side.      Bicep reflexes are 2+ on the right side and 2+ on the left side.      Brachioradialis reflexes are 2+ on the right side and  2+ on the left side.      Patellar reflexes are 2+ on the right side and 2+ on the left side. Psychiatric:        Speech: Speech normal.        Behavior: Behavior normal.  Thought Content: Thought content normal.     Assessment/Plan: Health Maintenance Due  Topic Date Due   Hepatitis C Screening  Never done   INFLUENZA VACCINE  10/21/2020   Health Maintenance reviewed - up to date; will set up follow up with gyn.  1. Preventative health care Discussed regular exercise; more important on weeks where she is feeling more irritable related to menstrual cycle. She prefers not to take medication if possible; so regular exercise plays important role in overall mood, health.  2. Hypothyroidism, unspecified type Managed by endo. Hoping she feels somewhat better with increased replacement per endo.  3. Pituitary adenoma ([place]) Followed by endo. Has been stable.   4. Psoriasis of scalp Limit hair washing, use moisturizing scalp treatments like tea tree oil. Fluocinolone for dry, itchy patches when they occur.   5. Other fatigue - CBC with Differential/Platelet; Future - Comprehensive metabolic panel; Future - VITAMIN D 25 Hydroxy (Vit-D Deficiency, Fractures); Future - Vitamin B12; Future - Vitamin B12 - VITAMIN D 25 Hydroxy (Vit-D Deficiency, Fractures) - Comprehensive metabolic panel - CBC with Differential/Platelet  6. Lipid screening - Lipid panel; Future - Lipid panel  7. Menorrhagia with regular cycle - IBC + Ferritin; Future - IBC + Ferritin  8. Mouth sores When discussing fatigue and asking some additional questions, she does state she has had some sores in mouth (none today). In addition to other eval; will add zinc to bloodwork. She feels that overall she is feeling better with this since adding molasses to her daily coffee.  - Zinc  Return in about 1 year (around 12/25/2021) for physical exam.  Micheline Rough, MD

## 2020-12-31 LAB — ZINC

## 2020-12-31 LAB — EXTRA SPECIMEN

## 2020-12-31 NOTE — Addendum Note (Signed)
Addended by: Agnes Lawrence on: 12/31/2020 01:47 PM   Modules accepted: Orders

## 2021-01-09 ENCOUNTER — Other Ambulatory Visit: Payer: Self-pay

## 2021-01-09 ENCOUNTER — Other Ambulatory Visit (INDEPENDENT_AMBULATORY_CARE_PROVIDER_SITE_OTHER): Payer: 59

## 2021-01-09 DIAGNOSIS — K1379 Other lesions of oral mucosa: Secondary | ICD-10-CM

## 2021-01-10 ENCOUNTER — Other Ambulatory Visit: Payer: 59

## 2021-01-14 LAB — ZINC: Zinc: 60 ug/dL (ref 60–130)

## 2021-12-26 ENCOUNTER — Encounter: Payer: Self-pay | Admitting: Family Medicine

## 2021-12-26 ENCOUNTER — Ambulatory Visit (INDEPENDENT_AMBULATORY_CARE_PROVIDER_SITE_OTHER): Payer: 59 | Admitting: Family Medicine

## 2021-12-26 ENCOUNTER — Encounter: Payer: 59 | Admitting: Family Medicine

## 2021-12-26 VITALS — BP 96/64 | HR 70 | Temp 98.6°F | Ht 62.25 in | Wt 165.6 lb

## 2021-12-26 DIAGNOSIS — Z13220 Encounter for screening for lipoid disorders: Secondary | ICD-10-CM | POA: Diagnosis not present

## 2021-12-26 DIAGNOSIS — Z23 Encounter for immunization: Secondary | ICD-10-CM

## 2021-12-26 DIAGNOSIS — Z0000 Encounter for general adult medical examination without abnormal findings: Secondary | ICD-10-CM

## 2021-12-26 DIAGNOSIS — E559 Vitamin D deficiency, unspecified: Secondary | ICD-10-CM

## 2021-12-26 DIAGNOSIS — E039 Hypothyroidism, unspecified: Secondary | ICD-10-CM | POA: Diagnosis not present

## 2021-12-26 NOTE — Assessment & Plan Note (Signed)
Follows with endo, reports her last TSH was normal.

## 2021-12-26 NOTE — Progress Notes (Signed)
Complete physical exam  Patient: Cynthia Love   DOB: Oct 04, 1985   36 y.o. Female  MRN: [id number]  Subjective:    Chief Complaint  Patient presents with   [place]    Cynthia Love is a 36 y.o. female who presents today for a complete physical exam. She reports consuming a general diet. Home exercise routine includes walking 0.5 hrs per day. She generally feels well. She reports sleeping could be better. She reports that it sometimes takes her a while to fall asleep, sometimes she has nighttime awakenings. She does not have additional problems to discuss today.   We reviewed her health maintenance, she [place] due for pap smear this year. She reports she wants to see her GYN for this. I advised her to call to schedule an appointment.   Most recent fall risk assessment:     No data to display           Most recent depression screenings:    12/26/2021    9:32 AM 12/25/2020   11:30 AM  PHQ 2/9 Scores  PHQ - 2 Score 0 1  PHQ- 9 Score 6 5      Patient Active Problem List   Diagnosis Date Noted   BMI 32.0-32.9,adult 11/30/2016   Pituitary adenoma ([place]) 11/30/2016   Hypothyroidism 10/16/2011      Patient Care Team: Farrel Conners, MD as PCP - General (Family Medicine) Obgyn, [facility]   Outpatient Medications Prior to Visit  Medication Sig   fluocinolone (SYNALAR) 0.01 % external solution Apply topically 2 (two) times daily.   levothyroxine (SYNTHROID) 150 MCG tablet    Multiple Vitamin (MULTIVITAMIN) tablet Take 1 tablet by mouth daily.   No facility-administered medications prior to visit.    Review of Systems  HENT:  Negative for hearing loss.   Eyes:  Negative for blurred vision.  Respiratory:  Negative for shortness of breath.   Cardiovascular:  Negative for chest pain.  Gastrointestinal: Negative.   Genitourinary: Negative.   Musculoskeletal:  Negative for back pain.  Neurological:  Negative for headaches.  Psychiatric/Behavioral:  Negative for  depression.   All other systems reviewed and are negative.      Objective:     BP 96/64 (BP Location: Right Arm, Patient Position: Sitting, Cuff Size: Normal)   Pulse 70   Temp 98.6 F (37 C) (Oral)   Ht 5' 2.25" (1.581 m)   Wt 165 lb 9.6 oz (75.1 kg)   LMP 11/26/2021 (Approximate)   SpO2 100%   BMI 30.05 kg/m    Physical Exam Vitals reviewed.  Constitutional:      Appearance: Normal appearance. She is well-groomed and normal weight.  HENT:     Right Ear: Tympanic membrane normal.     Left Ear: Tympanic membrane normal.  Eyes:     Conjunctiva/sclera: Conjunctivae normal.  Neck:     Thyroid: No thyromegaly.  Cardiovascular:     Rate and Rhythm: Normal rate and regular rhythm.     Pulses: Normal pulses.     Heart sounds: S1 normal and S2 normal.  Pulmonary:     Effort: Pulmonary effort is normal.     Breath sounds: Normal breath sounds and air entry.  Abdominal:     General: Abdomen is flat. Bowel sounds are normal.     Palpations: Abdomen is soft.  Musculoskeletal:        General: Normal range of motion.     Cervical back: Normal range of  motion and neck supple.     Right lower leg: No edema.     Left lower leg: No edema.  Skin:    General: Skin is warm and dry.  Neurological:     Mental Status: She is alert and oriented to person, place, and time. Mental status is at baseline.     Gait: Gait is intact.  Psychiatric:        Mood and Affect: Mood and affect normal.        Speech: Speech normal.        Behavior: Behavior normal.        Judgment: Judgment normal.      No results found for any visits on 12/26/21.     Assessment & Plan:    Routine Health Maintenance and Physical Exam  Immunization History  Administered Date(s) Administered   Influenza Split 01/11/2012   Influenza,inj,Quad PF,6+ Mos 11/30/2016, 12/08/2019, 12/25/2020   PFIZER(Purple Top)SARS-COV-2 Vaccination 07/01/2019, 08/01/2019, 02/23/2020   Tdap 03/24/2011    Health Maintenance   Topic Date Due   Hepatitis C Screening  Never done   INFLUENZA VACCINE  10/21/2021   PAP SMEAR-Modifier  12/25/2021   COVID-19 Vaccine (4 - [place] series) 01/11/2022 (Originally 04/19/2020)   TETANUS/TDAP  01/07/2026   HIV Screening  Completed   HPV VACCINES  Aged Out    Discussed health benefits of physical activity, and encouraged her to engage in regular exercise appropriate for her age and condition.  Problem List Items Addressed This Visit   None Visit Diagnoses     Vitamin D deficiency    -  Primary   Relevant Orders   Vitamin D, 25-hydroxy Pt had low vitamin D last year, this needs a repeat level this year.   Lipid screening       Relevant Orders   Lipid Panel   Routine physical examination       Relevant Orders   CMP Normal physical exam findings, we discussed sleep hygiene and I recommended OTC melatonin as needed to help her sleep at night. RTC if symptoms worsen or persist.   Immunization due       Relevant Orders   Flu Vaccine QUAD 6+ mos PF IM (Fluarix Quad PF)      No follow-ups on file.     Farrel Conners, MD

## 2021-12-31 ENCOUNTER — Other Ambulatory Visit (INDEPENDENT_AMBULATORY_CARE_PROVIDER_SITE_OTHER): Payer: 59

## 2021-12-31 DIAGNOSIS — Z13220 Encounter for screening for lipoid disorders: Secondary | ICD-10-CM

## 2021-12-31 DIAGNOSIS — Z0000 Encounter for general adult medical examination without abnormal findings: Secondary | ICD-10-CM | POA: Diagnosis not present

## 2021-12-31 DIAGNOSIS — E559 Vitamin D deficiency, unspecified: Secondary | ICD-10-CM | POA: Diagnosis not present

## 2021-12-31 LAB — COMPREHENSIVE METABOLIC PANEL
ALT: 13 U/L (ref 0–35)
AST: 16 U/L (ref 0–37)
Albumin: 4.3 g/dL (ref 3.5–5.2)
Alkaline Phosphatase: 36 U/L — ABNORMAL LOW (ref 39–117)
BUN: 17 mg/dL (ref 6–23)
CO2: 27 mEq/L (ref 19–32)
Calcium: 9.5 mg/dL (ref 8.4–10.5)
Chloride: 104 mEq/L (ref 96–112)
Creatinine, Ser: 0.83 mg/dL (ref 0.40–1.20)
GFR: 90.39 mL/min (ref >60.00)
Glucose, Bld: 81 mg/dL (ref 70–99)
Potassium: 4.3 mEq/L (ref 3.5–5.1)
Sodium: 138 mEq/L (ref 135–145)
Total Bilirubin: 0.6 mg/dL (ref 0.2–1.2)
Total Protein: 7.1 g/dL (ref 6.0–8.3)

## 2021-12-31 LAB — LIPID PANEL
Cholesterol: 170 mg/dL (ref 0–200)
HDL: 64.3 mg/dL (ref >39.00)
LDL Cholesterol: 94 mg/dL (ref 0–99)
NonHDL: 105.32
Total CHOL/HDL Ratio: 3
Triglycerides: 55 mg/dL (ref 0.0–149.0)
VLDL: 11 mg/dL (ref 0.0–40.0)

## 2021-12-31 LAB — VITAMIN D 25 HYDROXY (VIT D DEFICIENCY, FRACTURES): VITD: 36.75 ng/mL (ref 30.00–100.00)

## 2022-01-01 NOTE — Progress Notes (Signed)
Labs normal, vitamin D is good

## 2022-02-17 ENCOUNTER — Encounter: Payer: Self-pay | Admitting: Family Medicine

## 2022-02-17 ENCOUNTER — Ambulatory Visit (INDEPENDENT_AMBULATORY_CARE_PROVIDER_SITE_OTHER): Payer: 59 | Admitting: Family Medicine

## 2022-02-17 VITALS — BP 110/70 | HR 70 | Temp 98.2°F | Ht 62.25 in | Wt 165.4 lb

## 2022-02-17 DIAGNOSIS — M79674 Pain in right toe(s): Secondary | ICD-10-CM | POA: Diagnosis not present

## 2022-02-17 NOTE — Progress Notes (Signed)
Established Patient Office Visit  Subjective   Patient ID: Cynthia Love, female    DOB: Jan 17, 1986  Age: 36 y.o. MRN: [id number]  Chief Complaint  Patient presents with   Toe Injury    Patient complains of right toe injury, x1 day,    HPI   Cynthia Love seen with right third toe injury.  She states that last evening around 5 PM she was in her laundry room and stubbed her toe against a chair.  She had some pain immediately.  She noticed some bruising today and mild swelling.  Is able to bear weight but with some pain.  No other injuries reported.  Past Medical History:  Diagnosis Date   H/O: pituitary tumor    Headache(784.0)    MIGRAINE 1-2 X MONTH;  OTC MED   Hypothyroidism 2010   Infection    UTI X 1   Postpartum depression 04/19/2012   Vaginal Pap smear, abnormal    Past Surgical History:  Procedure Laterality Date   NO PAST SURGERIES      reports that she has never smoked. She has never used smokeless tobacco. She reports that she does not drink alcohol and does not use drugs. family history includes Cancer in her maternal grandfather and maternal grandmother; Diabetes in her father; Early death (age of onset: 66) in her father; Glaucoma in her mother; Hashimoto's thyroiditis in her mother; Heart disease in her father, paternal grandfather, and paternal grandmother; High blood pressure in her brother; Hyperlipidemia in her father; Hypertension in her father; Kidney disease in her father; Schizophrenia in her maternal aunt. Allergies  Allergen Reactions   Nickel Rash    Rash    Review of Systems  Neurological:  Negative for tingling and weakness.      Objective:     BP 110/70 (BP Location: Left Arm, Patient Position: Sitting, Cuff Size: Normal)   Pulse 70   Temp 98.2 F (36.8 C) (Oral)   Ht 5' 2.25" (1.581 m)   Wt 165 lb 6.4 oz (75 kg)   SpO2 98%   BMI 30.01 kg/m    Physical Exam Vitals reviewed.  Constitutional:      Appearance: Normal appearance.   Cardiovascular:     Rate and Rhythm: Normal rate.  Musculoskeletal:     Comments: Right third toe reveals some bruising along the distal to midportion with some localized distal phalanx tenderness.  No broken skin.  No warmth.  No erythema.  Neurological:     Mental Status: She is alert.      No results found for any visits on 02/17/22.    The ASCVD Risk score (Arnett DK, et al., 2019) failed to calculate for the following reasons:   The 2019 ASCVD risk score is only valid for ages 1 to 47    Assessment & Plan:   Problem List Items Addressed This Visit   None Visit Diagnoses     Toe pain, right    -  Primary   Relevant Orders   DG Toe 3rd Right     Right third toe pain following injury.  Suspect she may have small nondisplaced fracture.  Our x-ray was not available in office today at the time she got here around 4:30.  We did place future order for x-ray right third toe and she will decide tomorrow whether to come in for that.  Suspect this will be uncomplicated even if fractured.  We discussed importance of good comfortable shoe wear.  Option  of buddy taping to the next toe.  We also discussed other possible options such as cast shoe but she prefers to try running shoes at this time  No follow-ups on file.    Carolann Littler, MD

## 2022-07-02 ENCOUNTER — Encounter: Payer: Self-pay | Admitting: Family Medicine

## 2022-07-02 ENCOUNTER — Ambulatory Visit (INDEPENDENT_AMBULATORY_CARE_PROVIDER_SITE_OTHER): Payer: 59

## 2022-07-02 ENCOUNTER — Ambulatory Visit (INDEPENDENT_AMBULATORY_CARE_PROVIDER_SITE_OTHER): Payer: 59 | Admitting: Family Medicine

## 2022-07-02 VITALS — BP 130/88 | HR 77 | Temp 98.9°F | Ht 62.25 in | Wt 165.2 lb

## 2022-07-02 DIAGNOSIS — R051 Acute cough: Secondary | ICD-10-CM

## 2022-07-02 MED ORDER — ALBUTEROL SULFATE HFA 108 (90 BASE) MCG/ACT IN AERS: 2.0000 | INHALATION_SPRAY | Freq: Four times a day (QID) | RESPIRATORY_TRACT | 0 refills | Status: DC | PRN

## 2022-07-02 NOTE — Progress Notes (Signed)
   Established Patient Office Visit   Subjective  Patient ID: Cynthia Love, female    DOB: 04-11-1985  Age: 37 y.o. MRN: [id number]  Chief Complaint  Patient presents with   Congestion    Patient complains of congestion, x2 week   Cough    Patient complains of cough, x2 weeks    Pt is a 37 yo female followed by Dr, Casimiro Needle seen for ongoing concern.  Pt with cough x 2 wks. feels congestion in chest but is difficult to cough up.  Endorses some wheezing.  Tried OTC meds.  Denies fever, chills, headaches, ear pain/pressure, facial pain/pressure, sore throat, postnasal drainage, nausea, vomiting.  Cough      Review of Systems  Respiratory:  Positive for cough.    Negative unless stated above    Objective:     BP 130/88 (BP Location: Left Arm, Patient Position: Sitting, Cuff Size: Normal)   Pulse 77   Temp 98.9 F (37.2 C) (Oral)   Ht 5' 2.25" (1.581 m)   Wt 165 lb 3.2 oz (74.9 kg)   SpO2 (!) 10%   BMI 29.97 kg/m    Physical Exam Constitutional:      General: She is not in acute distress.    Appearance: Normal appearance.  HENT:     Head: Normocephalic and atraumatic.     Nose: Nose normal.     Mouth/Throat:     Mouth: Mucous membranes are moist.  Cardiovascular:     Rate and Rhythm: Normal rate and regular rhythm.     Heart sounds: Normal heart sounds. No murmur heard.    No gallop.  Pulmonary:     Effort: Pulmonary effort is normal. No respiratory distress.     Breath sounds: Rhonchi present. No wheezing or rales.  Skin:    General: Skin is warm and dry.  Neurological:     Mental Status: She is alert and oriented to person, place, and time.      No results found for any visits on 07/02/22.    Assessment & Plan:  Acute cough -     DG Chest 2 View -     Albuterol Sulfate HFA; Inhale 2 puffs into the lungs every 6 (six) hours as needed for wheezing or shortness of breath.  Dispense: 6.7 g; Refill: 0  Discussed possible causes of symptoms including  bronchitis, pneumonia.  Obtain CXR.  Albuterol inhaler as needed for wheezing or shortness of breath.  Continue supportive care with OTC medications.  Further recommendations based on imaging.  Return if symptoms worsen or fail to improve.   Deeann Saint, MD

## 2022-07-22 LAB — HM PAP SMEAR: HM Pap smear: NEGATIVE

## 2022-12-28 ENCOUNTER — Encounter: Payer: Self-pay | Admitting: Family Medicine

## 2022-12-28 ENCOUNTER — Ambulatory Visit (INDEPENDENT_AMBULATORY_CARE_PROVIDER_SITE_OTHER): Payer: 59 | Admitting: Family Medicine

## 2022-12-28 VITALS — BP 96/60 | HR 66 | Temp 98.7°F | Ht 62.0 in | Wt 172.7 lb

## 2022-12-28 DIAGNOSIS — Z23 Encounter for immunization: Secondary | ICD-10-CM

## 2022-12-28 DIAGNOSIS — E559 Vitamin D deficiency, unspecified: Secondary | ICD-10-CM | POA: Diagnosis not present

## 2022-12-28 DIAGNOSIS — Z0000 Encounter for general adult medical examination without abnormal findings: Secondary | ICD-10-CM | POA: Diagnosis not present

## 2022-12-28 DIAGNOSIS — Z13220 Encounter for screening for lipoid disorders: Secondary | ICD-10-CM

## 2022-12-28 NOTE — Patient Instructions (Signed)

## 2022-12-28 NOTE — Progress Notes (Signed)
Complete physical exam  Patient: Cynthia Love   DOB: 1985/05/11   36 y.o. Female  MRN: [id number]  Subjective:    Chief Complaint  Patient presents with   Annual Exam    [name] is a 37 y.o. female who presents today for a complete physical exam. She reports consuming a general diet. Gym/ health club routine includes zumba once a week and doing home exercise program for 20 minutes per day. She generally feels well. She reports sleeping fairly well. She does not have additional problems to discuss today.    Most recent fall risk assessment:     No data to display           Most recent depression screenings:    12/28/2022   10:19 AM 12/26/2021    9:32 AM  PHQ 2/9 Scores  PHQ - 2 Score 0 0  PHQ- 9 Score 2 6    Vision:Within last year and Dental: No current dental problems and Receives regular dental care  Patient Active Problem List   Diagnosis Date Noted   BMI 32.0-32.9,adult 11/30/2016   Pituitary adenoma ([facility]) 11/30/2016   Hypothyroidism 10/16/2011      Patient Care Team: Karie Georges, MD as PCP - General (Family Medicine) Obgyn, [facility]   Outpatient Medications Prior to Visit  Medication Sig   CALCIUM-VITAMIN D PO Take 800 mg by mouth.   Cholecalciferol (VITAMIN D3) 50 MCG (2000 UT) TABS Take by mouth.   levothyroxine (SYNTHROID) 150 MCG tablet    Multiple Vitamin (MULTIVITAMIN) tablet Take 1 tablet by mouth daily.   UNABLE TO FIND 200 mg. Med Name: Ginsing   [DISCONTINUED] albuterol (VENTOLIN HFA) 108 (90 Base) MCG/ACT inhaler Inhale 2 puffs into the lungs every 6 (six) hours as needed for wheezing or shortness of breath.   [DISCONTINUED] fluocinolone (SYNALAR) 0.01 % external solution Apply topically 2 (two) times daily.   No facility-administered medications prior to visit.    Review of Systems  HENT:  Negative for hearing loss.   Eyes:  Negative for blurred vision.  Respiratory:  Negative for shortness of breath.   Cardiovascular:   Negative for chest pain.  Gastrointestinal: Negative.   Genitourinary: Negative.   Musculoskeletal:  Negative for back pain.  Neurological:  Negative for headaches.  Psychiatric/Behavioral:  Negative for depression.   All other systems reviewed and are negative.      Objective:     BP 96/60 (BP Location: Left Arm, Patient Position: Sitting, Cuff Size: Large)   Pulse 66   Temp 98.7 F (37.1 C) (Oral)   Ht 5\' 2"  (1.575 m)   Wt 172 lb 11.2 oz (78.3 kg)   LMP 12/21/2022 (Approximate)   SpO2 99%   BMI 31.59 kg/m    Physical Exam Vitals reviewed.  Constitutional:      Appearance: Normal appearance. She is well-groomed and overweight.  HENT:     Right Ear: Tympanic membrane and ear canal normal.     Left Ear: Tympanic membrane and ear canal normal.     Mouth/Throat:     Mouth: Mucous membranes are moist.     Pharynx: No posterior oropharyngeal erythema.  Eyes:     Conjunctiva/sclera: Conjunctivae normal.  Neck:     Thyroid: No thyromegaly.  Cardiovascular:     Rate and Rhythm: Normal rate and regular rhythm.     Pulses: Normal pulses.     Heart sounds: S1 normal and S2 normal.  Pulmonary:  Effort: Pulmonary effort is normal.     Breath sounds: Normal breath sounds and air entry.  Abdominal:     General: Abdomen is flat. Bowel sounds are normal.     Palpations: Abdomen is soft.  Musculoskeletal:     Right lower leg: No edema.     Left lower leg: No edema.  Lymphadenopathy:     Cervical: No cervical adenopathy.  Neurological:     Mental Status: She is alert and oriented to person, place, and time. Mental status is at baseline.     Gait: Gait is intact.  Psychiatric:        Mood and Affect: Mood and affect normal.        Speech: Speech normal.        Behavior: Behavior normal.        Judgment: Judgment normal.      No results found for any visits on 12/28/22.     Assessment & Plan:    Routine Health Maintenance and Physical Exam  Immunization History   Administered Date(s) Administered   Influenza Split 01/11/2012   Influenza,inj,Quad PF,6+ Mos 11/30/2016, 12/08/2019, 12/25/2020, 12/26/2021   PFIZER(Purple Top)SARS-COV-2 Vaccination 07/01/2019, 08/01/2019, 02/23/2020   Tdap 03/24/2011, 12/28/2018    Health Maintenance  Topic Date Due   Hepatitis C Screening  Never done   Cervical Cancer Screening (HPV/Pap Cotest)  Never done   INFLUENZA VACCINE  10/22/2022   COVID-19 Vaccine (4 - 2023-24 season) 11/22/2022   DTaP/Tdap/Td (3 - Td or Tdap) 12/27/2028   HIV Screening  Completed   HPV VACCINES  Aged Out    Discussed health benefits of physical activity, and encouraged her to engage in regular exercise appropriate for her age and condition.  Need for lipid screening -     Lipid panel; Future  Routine general medical examination at a health care facility -     CBC with Differential/Platelet; Future -     Comprehensive metabolic panel; Future  Vitamin D deficiency -     VITAMIN D 25 Hydroxy (Vit-D Deficiency, Fractures); Future  Normal physical exam findings today. Counseled patient on healthy eating, also counseled on healthy sleep habits. Handouts given. Health maintenance reviewed, she sees [facility] for her pap smears, will ask for the results to be sent here, flu vaccine given today.   Return in 1 year (on 12/28/2023) for annual physical exam.     Karie Georges, MD

## 2022-12-29 ENCOUNTER — Encounter: Payer: Self-pay | Admitting: Family Medicine

## 2023-01-01 ENCOUNTER — Other Ambulatory Visit: Payer: 59

## 2023-01-01 DIAGNOSIS — E559 Vitamin D deficiency, unspecified: Secondary | ICD-10-CM | POA: Diagnosis not present

## 2023-01-01 DIAGNOSIS — Z0000 Encounter for general adult medical examination without abnormal findings: Secondary | ICD-10-CM

## 2023-01-01 DIAGNOSIS — Z13220 Encounter for screening for lipoid disorders: Secondary | ICD-10-CM

## 2023-01-01 LAB — COMPREHENSIVE METABOLIC PANEL
ALT: 15 U/L (ref 0–35)
AST: 18 U/L (ref 0–37)
Albumin: 4.3 g/dL (ref 3.5–5.2)
Alkaline Phosphatase: 40 U/L (ref 39–117)
BUN: 13 mg/dL (ref 6–23)
CO2: 28 meq/L (ref 19–32)
Calcium: 9.5 mg/dL (ref 8.4–10.5)
Chloride: 104 meq/L (ref 96–112)
Creatinine, Ser: 0.75 mg/dL (ref 0.40–1.20)
GFR: 101.37 mL/min (ref >60.00)
Glucose, Bld: 91 mg/dL (ref 70–99)
Potassium: 4.2 meq/L (ref 3.5–5.1)
Sodium: 139 meq/L (ref 135–145)
Total Bilirubin: 0.5 mg/dL (ref 0.2–1.2)
Total Protein: 6.6 g/dL (ref 6.0–8.3)

## 2023-01-01 LAB — CBC WITH DIFFERENTIAL/PLATELET
Basophils Absolute: 0 10*3/uL (ref 0.0–0.1)
Basophils Relative: 0.5 % (ref 0.0–3.0)
Eosinophils Absolute: 0.1 10*3/uL (ref 0.0–0.7)
Eosinophils Relative: 1.8 % (ref 0.0–5.0)
HCT: 38.8 % (ref 36.0–46.0)
Hemoglobin: 12.8 g/dL (ref 12.0–15.0)
Lymphocytes Relative: 20.5 % (ref 12.0–46.0)
Lymphs Abs: 1.5 10*3/uL (ref 0.7–4.0)
MCHC: 32.9 g/dL (ref 30.0–36.0)
MCV: 86.1 fL (ref 78.0–100.0)
Monocytes Absolute: 0.5 10*3/uL (ref 0.1–1.0)
Monocytes Relative: 6.6 % (ref 3.0–12.0)
Neutro Abs: 5 10*3/uL (ref 1.4–7.7)
Neutrophils Relative %: 70.6 % (ref 43.0–77.0)
Platelets: 186 10*3/uL (ref 150.0–400.0)
RBC: 4.51 Mil/uL (ref 3.87–5.11)
RDW: 12.8 % (ref 11.5–15.5)
WBC: 7.1 10*3/uL (ref 4.0–10.5)

## 2023-01-01 LAB — LIPID PANEL
Cholesterol: 180 mg/dL (ref 0–200)
HDL: 67 mg/dL (ref >39.00)
LDL Cholesterol: 103 mg/dL — ABNORMAL HIGH (ref 0–99)
NonHDL: 112.68
Total CHOL/HDL Ratio: 3
Triglycerides: 47 mg/dL (ref 0.0–149.0)
VLDL: 9.4 mg/dL (ref 0.0–40.0)

## 2023-01-01 LAB — VITAMIN D 25 HYDROXY (VIT D DEFICIENCY, FRACTURES): VITD: 19.49 ng/mL — ABNORMAL LOW (ref 30.00–100.00)

## 2023-08-06 ENCOUNTER — Ambulatory Visit (INDEPENDENT_AMBULATORY_CARE_PROVIDER_SITE_OTHER): Admitting: Family Medicine

## 2023-08-06 ENCOUNTER — Ambulatory Visit: Payer: Self-pay | Admitting: *Deleted

## 2023-08-06 VITALS — BP 130/88 | HR 88 | Temp 98.5°F | Wt 181.4 lb

## 2023-08-06 DIAGNOSIS — M2669 Other specified disorders of temporomandibular joint: Secondary | ICD-10-CM

## 2023-08-06 NOTE — Progress Notes (Signed)
 Established Patient Office Visit  Subjective   Patient ID: Cynthia Love, female    DOB: [birth year]  Age: 38 y.o. MRN: [id number]  Chief Complaint  Patient presents with   Jaw Pain    Patient complains of jaw pain, x1 month, Tried Tylenol  and Ibuprofen     Ear Pain    Patient complains of ear pain, Tried Tylenol  and Ibuprofen       HPI   Cynthia Love is seen with a left-sided jaw pain near her TMJ joint for about the past month.  Pain is somewhat intermittent.  She denies any injury.  She has had some pain around the left ear region.  She has taken Tylenol  and occasionally ibuprofen  with mild relief.  No known history of bruxism.  Denies any fever, chills, sore throat, or lymphadenopathy.  No recent dental work.  She has not noted any parotid swelling after eating  Past Medical History:  Diagnosis Date   H/O: pituitary tumor    Headache(784.0)    MIGRAINE 1-2 X MONTH;  OTC MED   Hypothyroidism 2010   Infection    UTI X 1   Postpartum depression 04/19/2012   Vaginal Pap smear, abnormal    Past Surgical History:  Procedure Laterality Date   NO PAST SURGERIES      reports that she has never smoked. She has never used smokeless tobacco. She reports that she does not drink alcohol and does not use drugs. family history includes Cancer in her maternal grandfather and maternal grandmother; Diabetes in her father; Early death (age of onset: 38) in her father; Glaucoma in her mother; Hashimoto's thyroiditis in her mother; Heart disease in her father, paternal grandfather, and paternal grandmother; High blood pressure in her brother; Hyperlipidemia in her father; Hypertension in her father; Kidney disease in her father; Schizophrenia in her maternal aunt. Allergies  Allergen Reactions   Nickel Rash    Rash    Review of Systems  Constitutional:  Negative for chills and fever.  HENT:  Positive for ear pain. Negative for sore throat.       Objective:     BP 130/88 (BP Location:  Left Arm, Patient Position: Sitting, Cuff Size: Large)   Pulse 88   Temp 98.5 F (36.9 C) (Oral)   Wt 181 lb 6.4 oz (82.3 kg)   SpO2 99%   BMI 33.18 kg/m  BP Readings from Last 3 Encounters:  08/06/23 130/88  12/28/22 96/60  07/02/22 130/88   Wt Readings from Last 3 Encounters:  08/06/23 181 lb 6.4 oz (82.3 kg)  12/28/22 172 lb 11.2 oz (78.3 kg)  07/02/22 165 lb 3.2 oz (74.9 kg)      Physical Exam Vitals reviewed.  Constitutional:      Appearance: Normal appearance.  HENT:     Head:     Comments: She has some tenderness over her left TMJ joint to palpation.  None on the right.  No visible swelling.    Right Ear: Tympanic membrane normal.     Left Ear: Tympanic membrane normal.     Mouth/Throat:     Mouth: Mucous membranes are moist.     Pharynx: Oropharynx is clear. No oropharyngeal exudate or posterior oropharyngeal erythema.  Cardiovascular:     Rate and Rhythm: Normal rate and regular rhythm.  Musculoskeletal:     Cervical back: Neck supple.  Lymphadenopathy:     Cervical: No cervical adenopathy.  Neurological:     Mental Status: She is alert.  No results found for any visits on 08/06/23.    The ASCVD Risk score (Arnett DK, et al., 2019) failed to calculate for the following reasons:   The 2019 ASCVD risk score is only valid for ages 29 to 61    Assessment & Plan:   Left facial pain.  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  No evidence for parotitis.  No evidence for otitis media.  No pharyngitis symptoms.  -Avoid hard to chew foods - Over-the-counter nonsteroidal such as Aleve or Advil  - Try some icing - If symptoms not improving over the next month or so consider referral to oral surgeon  Glean Lamy, MD

## 2023-08-06 NOTE — Telephone Encounter (Signed)
 Patient was scheduled for an appt today with Dr Darren Em at 3:30pm.

## 2023-08-06 NOTE — Telephone Encounter (Signed)
  Chief Complaint: left jaw pain Symptoms: pain in left jaw radiating to the ear Frequency: 3 weeks Pertinent Negatives: Patient denies facial swelling, rash, tooth pain Disposition: [] ED /[] Urgent Care (no appt availability in office) / [x] Appointment(In office/virtual)/ []  [facility]/ [] Home Care/ [] Refused Recommended Disposition /[] [facility]/ []  Follow-up with PCP Additional Notes: Appointment scheduled    Copied from CRM [id number]. Topic: Clinical - Red Word Triage >> Aug 06, 2023 11:05 AM [place] A wrote: [facility] that prompted transfer to Nurse Triage: Patient has pain in jaw having problems chewing and has swollen ear on left side. Reason for Disposition  [1] MODERATE pain (e.g., interferes with normal activities) AND [2] constant AND [3] present > 24 hours  Answer Assessment - Initial Assessment Questions 1. ONSET: "When did the pain start?" (e.g., minutes, hours, days)     Jaw pain- feels connected to inner ear- 2-3 weeks 2. ONSET: "Does the pain come and go, or has it been constant since it started?" (e.g., constant, intermittent, fleeting)     First 2 weeks - constant- now , still there 3. SEVERITY: "How bad is the pain?"   (Scale 1-10; mild, moderate or severe)   - MILD (1-3): doesn't interfere with normal activities    - MODERATE (4-7): interferes with normal activities or awakens from sleep    - SEVERE (8-10): excruciating pain, unable to do any normal activities      Stiff in am, moderate 4. LOCATION: "Where does it hurt?"      In jaw- joint- left  6. FEVER: "Do you have a fever?" If Yes, ask: "What is it, how was it measured, and when did it start?"      no 7. OTHER SYMPTOMS: "Do you have any other symptoms?" (e.g., fever, toothache, nasal discharge, nasal congestion, clicking sensation in jaw joint)     Ear discomfort, exposure to strep  Protocols used: Face Pain-A-AH

## 2024-01-04 ENCOUNTER — Ambulatory Visit: Admitting: Family Medicine

## 2024-01-04 ENCOUNTER — Encounter: Payer: Self-pay | Admitting: Family Medicine

## 2024-01-04 VITALS — BP 120/88 | HR 77 | Temp 98.4°F | Ht 62.75 in | Wt 186.0 lb

## 2024-01-04 DIAGNOSIS — Z0000 Encounter for general adult medical examination without abnormal findings: Secondary | ICD-10-CM | POA: Diagnosis not present

## 2024-01-04 DIAGNOSIS — Z13220 Encounter for screening for lipoid disorders: Secondary | ICD-10-CM | POA: Diagnosis not present

## 2024-01-04 DIAGNOSIS — E039 Hypothyroidism, unspecified: Secondary | ICD-10-CM

## 2024-01-04 DIAGNOSIS — E559 Vitamin D deficiency, unspecified: Secondary | ICD-10-CM

## 2024-01-04 DIAGNOSIS — Z1159 Encounter for screening for other viral diseases: Secondary | ICD-10-CM

## 2024-01-04 LAB — LIPID PANEL
Cholesterol: 205 mg/dL — ABNORMAL HIGH (ref 0–200)
HDL: 66.4 mg/dL (ref >39.00)
LDL Cholesterol: 122 mg/dL — ABNORMAL HIGH (ref 0–99)
NonHDL: 138.76
Total CHOL/HDL Ratio: 3
Triglycerides: 82 mg/dL (ref 0.0–149.0)
VLDL: 16.4 mg/dL (ref 0.0–40.0)

## 2024-01-04 LAB — CBC WITH DIFFERENTIAL/PLATELET
Basophils Absolute: 0 K/uL (ref 0.0–0.1)
Basophils Relative: 0.5 % (ref 0.0–3.0)
Eosinophils Absolute: 0.1 K/uL (ref 0.0–0.7)
Eosinophils Relative: 2.1 % (ref 0.0–5.0)
HCT: 39.7 % (ref 36.0–46.0)
Hemoglobin: 13.3 g/dL (ref 12.0–15.0)
Lymphocytes Relative: 25.5 % (ref 12.0–46.0)
Lymphs Abs: 1.4 K/uL (ref 0.7–4.0)
MCHC: 33.4 g/dL (ref 30.0–36.0)
MCV: 84.3 fl (ref 78.0–100.0)
Monocytes Absolute: 0.3 K/uL (ref 0.1–1.0)
Monocytes Relative: 5.2 % (ref 3.0–12.0)
Neutro Abs: 3.7 K/uL (ref 1.4–7.7)
Neutrophils Relative %: 66.7 % (ref 43.0–77.0)
Platelets: 193 K/uL (ref 150.0–400.0)
RBC: 4.7 Mil/uL (ref 3.87–5.11)
RDW: 13.2 % (ref 11.5–15.5)
WBC: 5.6 K/uL (ref 4.0–10.5)

## 2024-01-04 LAB — COMPREHENSIVE METABOLIC PANEL WITH GFR
ALT: 15 U/L (ref 0–35)
AST: 20 U/L (ref 0–37)
Albumin: 4.5 g/dL (ref 3.5–5.2)
Alkaline Phosphatase: 38 U/L — ABNORMAL LOW (ref 39–117)
BUN: 11 mg/dL (ref 6–23)
CO2: 28 meq/L (ref 19–32)
Calcium: 9.1 mg/dL (ref 8.4–10.5)
Chloride: 102 meq/L (ref 96–112)
Creatinine, Ser: 0.8 mg/dL (ref 0.40–1.20)
GFR: 93.15 mL/min (ref >60.00)
Glucose, Bld: 92 mg/dL (ref 70–99)
Potassium: 3.9 meq/L (ref 3.5–5.1)
Sodium: 137 meq/L (ref 135–145)
Total Bilirubin: 0.5 mg/dL (ref 0.2–1.2)
Total Protein: 7.1 g/dL (ref 6.0–8.3)

## 2024-01-04 LAB — VITAMIN D 25 HYDROXY (VIT D DEFICIENCY, FRACTURES): VITD: 15.11 ng/mL — ABNORMAL LOW (ref 30.00–100.00)

## 2024-01-04 NOTE — Progress Notes (Signed)
 Complete physical exam  Patient: Cynthia Love   DOB: 06-22-1985   38 y.o. Female  MRN: [id number]  Subjective:    Chief Complaint  Patient presents with   Annual Exam    Cynthia Love is a 38 y.o. female who presents today for a complete physical exam. She reports consuming a general diet. Pt tries to eat more of a mediterranean diet, eats veggies daily. Rarely eats  ultraprocessed food, eats fast food about once a week, usually tries to get the healthier options.  Gym/ health club routine includes cardio and once a week or about an hour, also goes walking once a week. She generally feels well. She reports sleeping fairly well. She does not have additional problems to discuss today.    Most recent fall risk assessment:     No data to display           Most recent depression screenings:    12/28/2022   10:19 AM 12/26/2021    9:32 AM  PHQ 2/9 Scores  PHQ - 2 Score 0 0  PHQ- 9 Score 2 6    Vision:Within last year and Dental: No current dental problems and Receives regular dental care  Patient Active Problem List   Diagnosis Date Noted   BMI 32.0-32.9,adult 11/30/2016   Pituitary adenoma (HCC) 11/30/2016   Hypothyroidism 10/16/2011      Patient Care Team: Ozell Cynthia HERO, MD as PCP - General (Family Medicine) Obgyn, [facility]   Outpatient Medications Prior to Visit  Medication Sig   CALCIUM-VITAMIN D  PO Take 800 mg by mouth.   levothyroxine  (SYNTHROID ) 150 MCG tablet    Prenatal Vit-Fe Fumarate-FA (PRENATAL VITAMINS PO) Take by mouth daily.   UNABLE TO FIND 200 mg. Med Name: Ginsing   [DISCONTINUED] Cholecalciferol (VITAMIN D3) 50 MCG (2000 UT) TABS Take by mouth.   [DISCONTINUED] Multiple Vitamin (MULTIVITAMIN) tablet Take 1 tablet by mouth daily.   No facility-administered medications prior to visit.    Review of Systems  HENT:  Negative for hearing loss.   Eyes:  Negative for blurred vision.  Respiratory:  Negative for shortness of breath.    Cardiovascular:  Negative for chest pain.  Gastrointestinal: Negative.   Genitourinary: Negative.   Musculoskeletal:  Negative for back pain.  Neurological:  Negative for headaches.  Psychiatric/Behavioral:  Negative for depression.        Objective:     BP 120/88   Pulse 77   Temp 98.4 F (36.9 C) (Oral)   Ht 5' 2.75 (1.594 m)   Wt 186 lb (84.4 kg)   LMP 12/21/2023 (Approximate)   SpO2 99%   BMI 33.21 kg/m    Physical Exam Vitals reviewed.  Constitutional:      Appearance: Normal appearance. She is well-groomed. She is obese.  HENT:     Right Ear: Tympanic membrane and ear canal normal.     Left Ear: Tympanic membrane and ear canal normal.     Mouth/Throat:     Mouth: Mucous membranes are moist.     Pharynx: No posterior oropharyngeal erythema.  Eyes:     Conjunctiva/sclera: Conjunctivae normal.  Neck:     Thyroid : No thyromegaly.  Cardiovascular:     Rate and Rhythm: Normal rate and regular rhythm.     Pulses: Normal pulses.     Heart sounds: S1 normal and S2 normal.  Pulmonary:     Effort: Pulmonary effort is normal.     Breath sounds: Normal breath sounds  and air entry.  Abdominal:     General: Abdomen is flat. Bowel sounds are normal.     Palpations: Abdomen is soft.  Musculoskeletal:     Right lower leg: No edema.     Left lower leg: No edema.  Lymphadenopathy:     Cervical: No cervical adenopathy.  Neurological:     Mental Status: She is alert and oriented to person, place, and time. Mental status is at baseline.     Gait: Gait is intact.  Psychiatric:        Mood and Affect: Mood and affect normal.        Speech: Speech normal.        Behavior: Behavior normal.        Judgment: Judgment normal.      No results found for any visits on 01/04/24.     Assessment & Plan:    Routine Health Maintenance and Physical Exam  Immunization History  Administered Date(s) Administered   Influenza Split 01/11/2012   Influenza, Seasonal, Injecte,  Preservative Fre 12/28/2022   Influenza,inj,Quad PF,6+ Mos 11/30/2016, 12/08/2019, 12/25/2020, 12/26/2021   Influenza,inj,quad, With Preservative 01/22/2015   Influenza-Unspecified 12/22/2018, 12/21/2023   PFIZER(Purple Top)SARS-COV-2 Vaccination 07/01/2019, 08/01/2019, 02/23/2020   Tdap 03/24/2011, 01/08/2016, 12/28/2018    Health Maintenance  Topic Date Due   Hepatitis C Screening  Never done   Hepatitis B Vaccines 19-59 Average Risk (1 of 3 - 19+ 3-dose series) Never done   HPV VACCINES (1 - 3-dose SCDM series) Never done   COVID-19 Vaccine (4 - 2025-26 season) 11/22/2023   Cervical Cancer Screening (HPV/Pap Cotest)  07/21/2025   DTaP/Tdap/Td (4 - Td or Tdap) 12/27/2028   Influenza Vaccine  Completed   HIV Screening  Completed   Pneumococcal Vaccine  Aged Out   Meningococcal B Vaccine  Aged Out    Discussed health benefits of physical activity, and encouraged her to engage in regular exercise appropriate for her age and condition.  Lipid screening -     Lipid panel; Future  Vitamin D  deficiency -     VITAMIN D  25 Hydroxy (Vit-D Deficiency, Fractures); Future  Routine general medical examination at a health care facility -     CBC with Differential/Platelet; Future -     Comprehensive metabolic panel with GFR; Future  Encounter for hepatitis C screening test for low risk patient -     Hepatitis C antibody; Future  Hypothyroidism, unspecified type  General physical exam findings are normal today. I reviewed the patient's preventative testing, immunizations, and lifestyle habits. I made appropriate recommendations and placed orders for the appropriate tests and/or vaccinations. I counseled the patient on the CDC's recommendations for healthy exercise and diet. I counseled the patient on healthy sleep habits and stress management. Handouts to reinforce the counseling were given at the conclusion of the visit.    Return in 1 year (on 01/03/2025).     Cynthia CHRISTELLA Sharper,  MD

## 2024-01-04 NOTE — Patient Instructions (Signed)

## 2024-01-05 LAB — HEPATITIS C ANTIBODY: Hepatitis C Ab: NONREACTIVE

## 2024-01-11 ENCOUNTER — Ambulatory Visit: Payer: Self-pay | Admitting: Family Medicine

## 2024-01-11 DIAGNOSIS — E559 Vitamin D deficiency, unspecified: Secondary | ICD-10-CM

## 2024-01-11 MED ORDER — VITAMIN D (ERGOCALCIFEROL) 1.25 MG (50000 UNIT) PO CAPS: 50000.0000 [IU] | ORAL_CAPSULE | ORAL | 1 refills | Status: AC

## 2025-01-04 ENCOUNTER — Encounter: Admitting: Family Medicine
